# Patient Record
Sex: Female | Born: 1990 | Race: White | Hispanic: No | Marital: Married | Smoking: Never smoker
Health system: Southern US, Community
[De-identification: ages and names within clinical notes are randomized; demographics above are authoritative.]

## PROBLEM LIST (undated history)

## (undated) DIAGNOSIS — O24419 Gestational diabetes mellitus in pregnancy, unspecified control: Secondary | ICD-10-CM

## (undated) HISTORY — DX: Gestational diabetes mellitus in pregnancy, unspecified control: O24.419

---

## 2021-07-24 DIAGNOSIS — Z3403 Encounter for supervision of normal first pregnancy, third trimester: Secondary | ICD-10-CM | POA: Insufficient documentation

## 2021-08-20 LAB — OB RESULTS CONSOLE HEPATITIS B SURFACE ANTIGEN: Hepatitis B Surface Ag: NEGATIVE

## 2021-08-20 LAB — OB RESULTS CONSOLE RUBELLA ANTIBODY, IGM: Rubella: IMMUNE

## 2021-08-20 LAB — OB RESULTS CONSOLE VARICELLA ZOSTER ANTIBODY, IGG: Varicella: IMMUNE

## 2021-09-29 NOTE — L&D Delivery Note (Addendum)
Delivery Note  30yo G1P0 at 40+1 IOL for A1gDM.  At 1:58 PM a viable and healthy female "Meredith Douglas" was delivered via Vaginal, Spontaneous (Presentation:   Occiput Anterior).  APGAR: 8, 9; weight 10 lb 0.1 oz (4540 g).   Placenta status: Extracted, Intact.  Cord: 3 vessels with the following complications: None.  Azithromycin given for manual extraction of the placenta and multiple manual extraction of clots from lower uterine segment. 0.2mg  IM methergine given for atony and bleeding at this time.  Anesthesia: Epidural Episiotomy: None Lacerations: 3rd degree Suture Repair: 2.0 vicryl Est. Blood Loss (mL):  400  Mom to postpartum.  Baby to Couplet care / Skin to Skin.  Evaluation of the perineum noted a third degree laceration, with the external anal sphincter 50% torn  The external sphincter was grasped with two long Allis clamps and brought to the midline. It was closed in an end-to-end fashion with 2-0 Vicryl in multiple interrupted stiches. It came together without excess tissues tension.  A layered closure was required for a total of 13.5cm. Figure of 8 sutures placed.   The rectovaginal septum and perineal body were brought together in running layers, adding in a crown stitch to bring the deep edges of the bulbocavernosus together in the midline.   The perineal edges were brought together in the midline in a subcuticular fashion.The last knot was buried behind the hymen.     [name] 03/15/2022, 2:42 PM

## 2021-10-20 DIAGNOSIS — O26892 Other specified pregnancy related conditions, second trimester: Secondary | ICD-10-CM | POA: Insufficient documentation

## 2021-10-20 DIAGNOSIS — Z2839 Other underimmunization status: Secondary | ICD-10-CM | POA: Insufficient documentation

## 2021-12-30 ENCOUNTER — Encounter: Payer: BC Managed Care – PPO | Attending: Obstetrics | Admitting: *Deleted

## 2021-12-30 ENCOUNTER — Encounter: Payer: Self-pay | Admitting: *Deleted

## 2021-12-30 VITALS — BP 108/68 | Ht 71.0 in | Wt 214.9 lb

## 2021-12-30 DIAGNOSIS — O24410 Gestational diabetes mellitus in pregnancy, diet controlled: Secondary | ICD-10-CM

## 2021-12-30 DIAGNOSIS — Z713 Dietary counseling and surveillance: Secondary | ICD-10-CM | POA: Insufficient documentation

## 2021-12-30 DIAGNOSIS — O24419 Gestational diabetes mellitus in pregnancy, unspecified control: Secondary | ICD-10-CM | POA: Diagnosis present

## 2021-12-30 DIAGNOSIS — Z3A00 Weeks of gestation of pregnancy not specified: Secondary | ICD-10-CM | POA: Diagnosis not present

## 2021-12-30 NOTE — Patient Instructions (Signed)
Read booklet on Gestational Diabetes ?Follow Gestational Meal Planning Guidelines ?Include 1 protein and 1 carbohydrate for snacks ?Complete a 3 Day Food Record and bring to next appointment ?Check blood sugars 4 x day - before breakfast and 2 hrs after every meal and record  ?Bring blood sugar log to all appointments ?Call MD for prescription for meter strips and lancets ?Strips   One Touch Verio  Lancets   One Touch Delica Plus ?Purchase urine ketone strips if instructed by MD and check urine ketones every am:  If + increase bedtime snack to 1 protein and 2 carbohydrate servings ?Walk 20-30 minutes at least 5 x week if permitted by MD ? ?

## 2021-12-31 NOTE — Progress Notes (Signed)
Diabetes Self-Management Education ? ?Visit Type: First/Initial ? ?Appt. Start Time: 1500 Appt. End Time: 1700 ? ?12/30/2021 ? ?Ms. Meredith Douglas, identified by name and date of birth, is a 31 y.o. female with a diagnosis of Diabetes: Gestational Diabetes.  ? ?ASSESSMENT ? ?Blood pressure 108/68, height 5\' 11"  (1.803 m), weight 214 lb 14.4 oz (97.5 kg), last menstrual period 05/15/2021, estimated date of delivery 03/14/2022 ?Body mass index is 29.97 kg/m?. ? ? Diabetes Self-Management Education - 12/30/21 1826   ? ?  ? Visit Information  ? Visit Type First/Initial   ?  ? Initial Visit  ? Diabetes Type Gestational Diabetes   ? Are you currently following a meal plan? Yes   ? What type of meal plan do you follow? "low carb and low sugar"   ? Are you taking your medications as prescribed? Yes   ? Date Diagnosed 12/24/2021   ?  ? Health Coping  ? How would you rate your overall health? Good   ?  ? Psychosocial Assessment  ? Patient Belief/Attitude about Diabetes Other (comment)   "stressed"  ? Self-care barriers None   ? Self-management support Doctor's office;Family   ? Patient Concerns Glycemic Control;Nutrition/Meal planning;Medication;Monitoring   ? Special Needs None   ? Preferred Learning Style Auditory;Visual   ? Learning Readiness Change in progress   ? How often do you need to have someone help you when you read instructions, pamphlets, or other written materials from your doctor or pharmacy? 1 - Never   ? What is the last grade level you completed in school? Undergrad   ?  ? Pre-Education Assessment  ? Patient understands the diabetes disease and treatment process. Needs Instruction   ? Patient understands incorporating nutritional management into lifestyle. Needs Instruction   ? Patient undertands incorporating physical activity into lifestyle. Needs Instruction   ? Patient understands using medications safely. Needs Instruction   ? Patient understands monitoring blood glucose, interpreting and using results  Needs Instruction   ? Patient understands prevention, detection, and treatment of acute complications. Needs Instruction   ? Patient understands prevention, detection, and treatment of chronic complications. Needs Instruction   ? Patient understands how to develop strategies to address psychosocial issues. Needs Instruction   ? Patient understands how to develop strategies to promote health/change behavior. Needs Instruction   ?  ? Complications  ? Last HgB A1C per patient/outside source 5.5 %   08/19/2021  ? How often do you check your blood sugar? 0 times/day (not testing)   Provided One Touch Verio Flex meter and instructed on use. BG upon return demonstration was 83 mg/dL at 4:45 pm - 5 1/2 hrs pp  ? Have you had a dilated eye exam in the past 12 months? No   ? Have you had a dental exam in the past 12 months? Yes   ? Are you checking your feet? Yes   ? How many days per week are you checking your feet? 7   ?  ? Dietary Intake  ? Breakfast eggs, 2 pieces of toast or fruit (banana, orange)   ? Snack (morning) protein shake, crackers, string cheese   ? Lunch salad with protein, left overs   ? Snack (afternoon) same as morning snack   ? Dinner protein, starch and green vegetable - chicken, pork, beef; potatoes, beans, corn, rice, pasta, broccoli, green beans, salads with lettuce tomato onions cucumbers olives cheese   ? Beverage(s) water, coffee with oat milk 2-3 x week   ?  ?  Exercise  ? Exercise Type Moderate (swimming / aerobic walking)   cardio and weights  ? How many days per week to you exercise? 3.5   ? How many minutes per day do you exercise? 45   ? Total minutes per week of exercise 157.5   ?  ? Patient Education  ? Previous Diabetes Education No   ? Disease state  Definition of diabetes, type 1 and 2, and the diagnosis of diabetes;Factors that contribute to the development of diabetes   ? Nutrition management  Role of diet in the treatment of diabetes and the relationship between the three main  macronutrients and blood glucose level;Food label reading, portion sizes and measuring food.;Reviewed blood glucose goals for pre and post meals and how to evaluate the patients' food intake on their blood glucose level.   ? Physical activity and exercise  Role of exercise on diabetes management, blood pressure control and cardiac health.   ? Medications Other (comment)   Limited use of oral medications during pregnancy and potential for insulin  ? Monitoring Taught/evaluated SMBG meter.;Purpose and frequency of SMBG.;Taught/discussed recording of test results and interpretation of SMBG.;Identified appropriate SMBG and/or A1C goals.;Ketone testing, when, how.   ? Chronic complications Relationship between chronic complications and blood glucose control   ? Psychosocial adjustment Role of stress on diabetes;Identified and addressed patients feelings and concerns about diabetes   ? Preconception care Pregnancy and GDM  Role of pre-pregnancy blood glucose control on the development of the fetus;Reviewed with patient blood glucose goals with pregnancy;Role of family planning for patients with diabetes   ?  ? Individualized Goals (developed by patient)  ? Reducing Risk Other (comment)   improve blood sugars, decrease medications, prevent diabetes complications  ?  ? Outcomes  ? Expected Outcomes Demonstrated interest in learning. Expect positive outcomes   ? Future DMSE 2 wks   ? ?  ?  ?Individualized Plan for Diabetes Self-Management Training:  ? ?Learning Objective:  Patient will have a greater understanding of diabetes self-management. ?Patient education plan is to attend individual and/or group sessions per assessed needs and concerns. ?  ?Plan:  ? ?Patient Instructions  ?Read booklet on Gestational Diabetes ?Follow Gestational Meal Planning Guidelines ?Include 1 protein and 1 carbohydrate for snacks ?Complete a 3 Day Food Record and bring to next appointment ?Check blood sugars 4 x day - before breakfast and 2 hrs  after every meal and record  ?Bring blood sugar log to all appointments ?Call MD for prescription for meter strips and lancets ?Strips   One Touch Verio  Lancets   One Touch Delica Plus ?Purchase urine ketone strips if instructed by MD and check urine ketones every am:  If + increase bedtime snack to 1 protein and 2 carbohydrate servings ?Walk 20-30 minutes at least 5 x week if permitted by MD ? ?Expected Outcomes:  Demonstrated interest in learning. Expect positive outcomes ? ?Education material provided:  ?Gestational Booklet ?Gestational Meal Planning Guidelines ?Simple Meal Plan ?Meter - One Touch Verio Flex ?3 Day Food Record ?Goals for a Healthy Pregnancy ?Carb-Mindful Smoothie Handout ? ?If problems or questions, patient to contact team via:   ?Johny Drilling, RN, [place], [place] [phone] ? ?Future DSME appointment: 2 wks ?January 13, 2022 with the dietitian ?

## 2022-01-07 DIAGNOSIS — O24410 Gestational diabetes mellitus in pregnancy, diet controlled: Secondary | ICD-10-CM | POA: Insufficient documentation

## 2022-01-08 ENCOUNTER — Encounter: Payer: Self-pay | Admitting: Dietician

## 2022-01-08 ENCOUNTER — Encounter: Payer: BC Managed Care – PPO | Admitting: Dietician

## 2022-01-08 VITALS — Ht 71.0 in | Wt 214.9 lb

## 2022-01-08 DIAGNOSIS — O24419 Gestational diabetes mellitus in pregnancy, unspecified control: Secondary | ICD-10-CM | POA: Diagnosis not present

## 2022-01-08 DIAGNOSIS — O24410 Gestational diabetes mellitus in pregnancy, diet controlled: Secondary | ICD-10-CM

## 2022-01-08 NOTE — Progress Notes (Signed)
Patient's BG record indicates fasting BGs ranging 80-88 + one of 7 readings at 103, after missing dinner the day before (late lunch), and post-meal BGs ranging 82-106 + 2 of 22 readings at 124, 135 after high carb meals ?Patient's food diary indicates meals and snacks at regular intervals, healthy food choices and balanced nutrition. She reports following low carb eating pattern prior to pregnancy and has increased carb intake for nutritional adequacy.   ?Provided basic balanced meal plan; patient is doing well with meal planning and does not need additional resources or instruction. ?Instructed patient on food safety, including avoidance of Listeriosis, and limiting mercury from fish. ?Discussed importance of maintaining healthy lifestyle habits to reduce risk of Type 2 DM as well as Gestational DM with any future pregnancies. ?Advised patient to use any remaining testing supplies to test some BGs after delivery, and to have BG tested ideally annually, as well as prior to attempting future pregnancies. ? ?

## 2022-01-08 NOTE — Patient Instructions (Signed)
Continue with current healthy food choices and balanced meals, great job! ?Keep up regular exercise.  ?

## 2022-01-10 ENCOUNTER — Other Ambulatory Visit: Payer: Self-pay

## 2022-01-10 ENCOUNTER — Emergency Department: Payer: BC Managed Care – PPO

## 2022-01-10 ENCOUNTER — Observation Stay
Admission: EM | Admit: 2022-01-10 | Discharge: 2022-01-11 | Disposition: A | Discharge status: Home / Self Care | Payer: BC Managed Care – PPO | Attending: Certified Nurse Midwife | Admitting: Certified Nurse Midwife

## 2022-01-10 DIAGNOSIS — R109 Unspecified abdominal pain: Secondary | ICD-10-CM | POA: Diagnosis present

## 2022-01-10 DIAGNOSIS — O24410 Gestational diabetes mellitus in pregnancy, diet controlled: Secondary | ICD-10-CM | POA: Diagnosis not present

## 2022-01-10 DIAGNOSIS — O2303 Infections of kidney in pregnancy, third trimester: Principal | ICD-10-CM | POA: Insufficient documentation

## 2022-01-10 DIAGNOSIS — Z3A31 31 weeks gestation of pregnancy: Secondary | ICD-10-CM | POA: Insufficient documentation

## 2022-01-10 DIAGNOSIS — O26893 Other specified pregnancy related conditions, third trimester: Secondary | ICD-10-CM | POA: Diagnosis present

## 2022-01-10 LAB — CBC
HCT: 39.4 % (ref 36.0–46.0)
HCT: 34.2 % — ABNORMAL LOW (ref 36.0–46.0)
Hemoglobin: 12.8 g/dL (ref 12.0–15.0)
Hemoglobin: 11.3 g/dL — ABNORMAL LOW (ref 12.0–15.0)
MCH: 29.1 pg (ref 26.0–34.0)
MCH: 29.7 pg (ref 26.0–34.0)
MCHC: 32.5 g/dL (ref 30.0–36.0)
MCHC: 33 g/dL (ref 30.0–36.0)
MCV: 89.5 fL (ref 80.0–100.0)
MCV: 89.8 fL (ref 80.0–100.0)
Platelets: 247 10*3/uL (ref 150–400)
Platelets: 214 10*3/uL (ref 150–400)
RBC: 4.4 MIL/uL (ref 3.87–5.11)
RBC: 3.81 MIL/uL — ABNORMAL LOW (ref 3.87–5.11)
RDW: 12.8 % (ref 11.5–15.5)
RDW: 12.8 % (ref 11.5–15.5)
WBC: 10.2 10*3/uL (ref 4.0–10.5)
WBC: 10.6 10*3/uL — ABNORMAL HIGH (ref 4.0–10.5)
nRBC: 0 % (ref 0.0–0.2)
nRBC: 0 % (ref 0.0–0.2)

## 2022-01-10 LAB — BASIC METABOLIC PANEL
Anion gap: 9 (ref 5–15)
BUN: 8 mg/dL (ref 6–20)
CO2: 25 mmol/L (ref 22–32)
Calcium: 9.4 mg/dL (ref 8.9–10.3)
Chloride: 103 mmol/L (ref 98–111)
Creatinine, Ser: 0.58 mg/dL (ref 0.44–1.00)
GFR, Estimated: >60 mL/min (ref >60)
Glucose, Bld: 98 mg/dL (ref 70–99)
Potassium: 3.8 mmol/L (ref 3.5–5.1)
Sodium: 137 mmol/L (ref 135–145)

## 2022-01-10 LAB — URINALYSIS, ROUTINE W REFLEX MICROSCOPIC
Bilirubin Urine: NEGATIVE
Glucose, UA: NEGATIVE mg/dL
Ketones, ur: 20 mg/dL — AB
Leukocytes,Ua: NEGATIVE
Nitrite: NEGATIVE
Protein, ur: NEGATIVE mg/dL
Specific Gravity, Urine: 1.011 (ref 1.005–1.030)
pH: 6 (ref 5.0–8.0)

## 2022-01-10 MED ORDER — SODIUM CHLORIDE 0.9 % IV SOLN
1.0000 g | Freq: Once | INTRAVENOUS | Status: AC
  Administered 2022-01-10: 1 g via INTRAVENOUS
  Filled 2022-01-10: qty 10

## 2022-01-10 MED ORDER — ACETAMINOPHEN 500 MG PO TABS
1000.0000 mg | ORAL_TABLET | Freq: Once | ORAL | Status: AC
  Administered 2022-01-10: 1000 mg via ORAL
  Filled 2022-01-10: qty 2

## 2022-01-10 MED ORDER — LACTATED RINGERS IV BOLUS
1000.0000 mL | Freq: Once | INTRAVENOUS | Status: AC
  Administered 2022-01-10: 1000 mL via INTRAVENOUS

## 2022-01-10 NOTE — ED Provider Notes (Signed)
? ?[facility] ?Provider Note ? ? ? Event Date/Time  ? First MD Initiated Contact with Patient 01/10/22 1826   ?  (approximate) ? ? ?History  ? ?Chief Complaint ?Flank Pain ? ? ?HPI ? ?Meredith Douglas is a 31 y.o. female, G1P0 at approximately 31 weeks of pregnancy with no significant past medical history, presents to the ED complaining of flank pain.  Patient reports that she has been dealing with waxing and waning pain starting in her left flank and radiating down towards the left lower quadrant of her abdomen.  Pain is described as sharp and exacerbated when she goes to take a deep breath.  It has been gradually increasing in frequency and severity over the course of the day and she reports feeling nauseous with the pain, but has not vomited.  She noticed a slight bloody tinge to her urine earlier in the day, but denies any dysuria.  She has not had any fevers and denies any vaginal bleeding or discharge.  She follows with [facility] for [facility], spoke with them over the phone with the onset of symptoms and was sent for urinalysis.  UA did not appear concerning for infection but there was blood present and so patient was sent to the ED for further evaluation for possible kidney stone.  She denies any personal or family history of kidney stones.  She has not taken anything for her symptoms prior to arrival. ?  ? ? ?Physical Exam  ? ?Triage Vital Signs: ?ED Triage Vitals  ?Enc Vitals Group  ?   BP 01/10/22 1605 (!) 143/88  ?   Pulse Rate 01/10/22 1605 (!) 107  ?   Resp 01/10/22 1605 20  ?   Temp 01/10/22 1605 98 ?F (36.7 ?C)  ?   Temp src --   ?   SpO2 01/10/22 1605 97 %  ?   Weight 01/10/22 1609 214 lb (97.1 kg)  ?   Height 01/10/22 1609 5\' 11"  (1.803 m)  ?   Head Circumference --   ?   Peak Flow --   ?   Pain Score 01/10/22 1606 8  ?   Pain Loc --   ?   Pain Edu? --   ?   Excl. in GC? --   ? ? ?Most recent vital signs: ?Vitals:  ? 01/10/22 1605 01/10/22 1837  ?BP: (!) 143/88  140/78  ?Pulse: (!) 107 100  ?Resp: 20 20  ?Temp: 98 ?F (36.7 ?C)   ?SpO2: 97% 97%  ? ? ?Constitutional: Alert and oriented. ?Eyes: Conjunctivae are normal. ?Head: Atraumatic. ?Nose: No congestion/rhinnorhea. ?Mouth/Throat: Mucous membranes are moist.  ?Cardiovascular: Normal rate, regular rhythm. Grossly normal heart sounds.  2+ radial pulses bilaterally. ?Respiratory: Normal respiratory effort.  No retractions. Lungs CTAB. ?Gastrointestinal: Gravid abdomen soft and nontender.  Left CVA tenderness to palpation noted.  No distention. ?Musculoskeletal: No lower extremity tenderness nor edema.  ?Neurologic:  Normal speech and language. No gross focal neurologic deficits are appreciated. ? ? ? ?ED Results / Procedures / Treatments  ? ?Labs ?(all labs ordered are listed, but only abnormal results are displayed) ?Labs Reviewed  ?URINALYSIS, ROUTINE W REFLEX MICROSCOPIC - Abnormal; Notable for the following components:  ?    Result Value  ? Color, Urine STRAW (*)   ? APPearance CLEAR (*)   ? Hgb urine dipstick SMALL (*)   ? Ketones, ur 20 (*)   ? Bacteria, UA MANY (*)   ? All  other components within normal limits  ?URINE CULTURE  ?CBC  ?BASIC METABOLIC PANEL  ? ? ?RADIOLOGY ?Renal ultrasound reviewed by me with no obvious hydronephrosis. ? ?PROCEDURES: ? ?Critical Care performed: No ? ?Procedures ? ? ?MEDICATIONS ORDERED IN ED: ?Medications  ?cefTRIAXone (ROCEPHIN) 1 g in sodium chloride 0.9 % 100 mL IVPB (has no administration in time range)  ?lactated ringers bolus 1,000 mL (has no administration in time range)  ?acetaminophen (TYLENOL) tablet 1,000 mg (1,000 mg Oral Given by Other 01/10/22 1850)  ? ? ? ?IMPRESSION / MDM / ASSESSMENT AND PLAN / ED COURSE  ?I reviewed the triage vital signs and the nursing notes. ?             ?               ? ?31 y.o. female, G1P0 at approximately 31 weeks of pregnancy, presents to the ED complaining of gradually worsening left flank pain radiating towards the left lower quadrant of her  abdomen starting earlier today. ? ?Differential diagnosis includes, but is not limited to, pyelonephritis, kidney stone, cystitis, labor pains. ? ?Patient nontoxic-appearing and in no acute distress, vital signs are unremarkable.  She has a benign abdominal exam but does have some CVA tenderness on the left.  Initial UA obtained as an outpatient appears equivocal and at least partly contaminated, we will recheck UA here in the ED.  Presence of blood could be attributable to infection versus kidney stone and we will further assess with ultrasound for hydronephrosis that would suggest presence of kidney stone.  Labs are reassuring with CBC showing no anemia or leukocytosis, BMP without electrolyte abnormality or AKI.  Patient offered pain medication and prefers to try Tylenol at first for her symptoms. ? ?UA obtained here in the ED is more concerning for infection with no RBCs but 11-20 WBCs and many bacteria.  We will send for culture and treat with dose of Rocephin.  Renal ultrasound shows no frank hydronephrosis concerning for kidney stone.  Case discussed with Dr. Dalbert Garnet of OB/GYN, who will arrange for patient to be evaluated by one of the midwives.  Anticipate admission for observation given concern for pyelonephritis in the setting of pregnancy. ? ?  ? ? ?FINAL CLINICAL IMPRESSION(S) / ED DIAGNOSES  ? ?Final diagnoses:  ?Pyelonephritis affecting pregnancy in third trimester  ? ? ? ?Rx / DC Orders  ? ?ED Discharge Orders   ? ? None  ? ?  ? ? ? ?Note:  This document was prepared using Dragon voice recognition software and may include unintentional dictation errors. ?  ?Chesley Noon, MD ?01/10/22 2016 ? ?

## 2022-01-10 NOTE — H&P (Signed)
OB History & Physical  ? ?History of Present Illness:  ?Chief Complaint:  ? ?HPI:  ?Meredith Douglas is a 31 y.o. G1P0 female at [redacted]w[redacted]d dated by 6 week u/s.  She presents to L&D after being evaluated in the ED for left flank pain for overnight observation. She is suspected bladder infections vs pyelo.   ? ?She reports:  ?-active fetal movement ?-no leakage of fluid ?-no vaginal bleeding ?-no contractions ? ?Pregnancy Issues: ?1. GDM (Uncomplicated pregnancy, and good glucose control by diet) ?2. RH Negative ?3. Rubella NON Immune ? ? ?Maternal Medical History:  ? ?Past Medical History:  ?Diagnosis Date  ? Gestational diabetes   ? ? ?History reviewed. No pertinent surgical history. ? ?Not on File ? ?Prior to Admission medications   ?Medication Sig Start Date End Date Taking? Authorizing Provider  ?Prenatal Multivit-Min-Fe-FA (PRENATAL, W/IRON & FA,) 27-0.8 MG TABS Take 1 tablet by mouth daily.   Yes [provider]  ? ? ? ?Prenatal care site: [facility]  ? ?Social History: She  reports that she has never smoked. She has never used smokeless tobacco. She reports that she does not drink alcohol. ? ?Family History: family history is not on file.  ? ?Review of Systems: A full review of systems was performed and negative except as noted in the HPI.   ? ?Physical Exam:  ?Vital Signs: BP 125/76   Pulse 93   Temp 98 ?F (36.7 ?C)   Resp 20   Ht 5\' 11"  (1.803 m)   Wt 97.1 kg   LMP 05/15/2021 (Approximate)   SpO2 97%   BMI 29.85 kg/m?  ? ?General:   alert and cooperative  ?Skin:  normal  ?Neurologic:    Alert & oriented x 3  ?Lungs:    Nl effort  ?Heart:   regular rate and rhythm  ?Abdomen:  soft, non-tender; bowel sounds normal; no masses,  no organomegaly  ?Extremities: : non-tender, symmetric, no edema bilaterally.     ? ? ?Results for orders placed or performed during the hospital encounter of 01/10/22 (from the past 24 hour(s))  ?CBC     Status: None  ? Collection Time: 01/10/22  4:09 PM  ?Result  Value Ref Range  ? WBC 10.2 4.0 - 10.5 K/uL  ? RBC 4.40 3.87 - 5.11 MIL/uL  ? Hemoglobin 12.8 12.0 - 15.0 g/dL  ? HCT 39.4 36.0 - 46.0 %  ? MCV 89.5 80.0 - 100.0 fL  ? MCH 29.1 26.0 - 34.0 pg  ? MCHC 32.5 30.0 - 36.0 g/dL  ? RDW 12.8 11.5 - 15.5 %  ? Platelets 247 150 - 400 K/uL  ? nRBC 0.0 0.0 - 0.2 %  ?Basic metabolic panel     Status: None  ? Collection Time: 01/10/22  4:09 PM  ?Result Value Ref Range  ? Sodium 137 135 - 145 mmol/L  ? Potassium 3.8 3.5 - 5.1 mmol/L  ? Chloride 103 98 - 111 mmol/L  ? CO2 25 22 - 32 mmol/L  ? Glucose, Bld 98 70 - 99 mg/dL  ? BUN 8 6 - 20 mg/dL  ? Creatinine, Ser 0.58 0.44 - 1.00 mg/dL  ? Calcium 9.4 8.9 - 10.3 mg/dL  ? GFR, Estimated >60 >60 mL/min  ? Anion gap 9 5 - 15  ?Urinalysis, Routine w reflex microscopic Urine, Clean Catch     Status: Abnormal  ? Collection Time: 01/10/22  7:11 PM  ?Result Value Ref Range  ? Color, Urine STRAW (  A) YELLOW  ? APPearance CLEAR (A) CLEAR  ? Specific Gravity, Urine 1.011 1.005 - 1.030  ? pH 6.0 5.0 - 8.0  ? Glucose, UA NEGATIVE NEGATIVE mg/dL  ? Hgb urine dipstick SMALL (A) NEGATIVE  ? Bilirubin Urine NEGATIVE NEGATIVE  ? Ketones, ur 20 (A) NEGATIVE mg/dL  ? Protein, ur NEGATIVE NEGATIVE mg/dL  ? Nitrite NEGATIVE NEGATIVE  ? Leukocytes,Ua NEGATIVE NEGATIVE  ? RBC / HPF 0-5 0 - 5 RBC/hpf  ? WBC, UA 11-20 0 - 5 WBC/hpf  ? Bacteria, UA MANY (A) NONE SEEN  ? Squamous Epithelial / LPF 0-5 0 - 5  ? Mucus PRESENT   ? ? ?Pertinent Results:  ? ?FHT: FHR: 135 bpm, variability: moderate,  accelerations:  Present,  decelerations:  Absent ?Category/reactivity:  Category I ?TOCO: none ?SVE: deferred ?  ? ? ?US RENAL ? ?Result Date: 01/10/2022 ?CLINICAL DATA:  Left flank pain, [redacted] weeks pregnant EXAM: RENAL / URINARY TRACT ULTRASOUND COMPLETE COMPARISON:  None. FINDINGS: Right Kidney: Renal measurements: 12.6 x 5.1 x 5.7 cm = volume: 192.4 mL. Echogenicity within normal limits. No mass or hydronephrosis visualized. Left Kidney: Renal measurements: 12.7 x 5.8 x  5.1 cm = volume: 196.3 mL. Echogenicity within normal limits. Mild fullness of the renal pelvis without hydronephrosis or renal mass. Bladder: Appears normal for degree of bladder distention. Other: None. IMPRESSION: 1. Mild fullness of the left renal pelvis without frank hydronephrosis. This may be physiologic dilatation given gravid state. 2. Otherwise unremarkable exam. Electronically Signed   By: Sharlet Salina M.D.   On: 01/10/2022 19:53   ? ? ?Assessment:  ?Meredith Douglas is a 31 y.o. G1P0 female at [redacted]w[redacted]d with left flank pain.  ? ?Plan:  ?1. Admit to [facility] for obs over night ? ?2. Fetal Well being  ?- Fetal Tracing: Cat I ?- Toco quiet ? ?3. C/w Dr. Dalbert Garnet ?- CBC in am ?- Carb mod diet ?- IVF ?- monitor vitals ? ? ? ?Haroldine Laws, CNM ?01/10/2022 10:15 PM ? ?  ?

## 2022-01-10 NOTE — ED Triage Notes (Signed)
Pt to ED for left flank pain that started this am. Reports went to [facility] and was told UA had blood in it and sent to ER ?Denies N.v.  ?Denies dysuria ? ?Pt is [redacted] weeks pregnant.  ? ?Contacted L&D, states wants pt evaluated in ED first.  ?

## 2022-01-10 NOTE — ED Notes (Signed)
Pt given warm blanket. Visitor remains at bedside.  ?

## 2022-01-10 NOTE — ED Notes (Signed)
See triage note  presents with left side pain which started this am  pain became progressively worse thur out the day  pt is [redacted] weeks pregnant   ?

## 2022-01-10 NOTE — ED Notes (Signed)
Will started LR once rocephin finished since incompatible.  ?

## 2022-01-10 NOTE — ED Notes (Signed)
See triage note. Pt denies nausea. Continues to have mid L sided/flank pain at times; pain is intermittent; denies pain currently; states pain increases with movement such as standing; denies changes with urination aside from noting urine was an "orange-like color" earlier today.  ?

## 2022-01-10 NOTE — OB Triage Note (Signed)
Pt arrives from ED with c/o flank pain which she was seen for in ED. Pt states pain at this 1/10 and feels much better. Pt is in NAD. ?

## 2022-01-11 ENCOUNTER — Other Ambulatory Visit: Payer: Self-pay

## 2022-01-11 LAB — CBC WITH DIFFERENTIAL/PLATELET
Abs Immature Granulocytes: 0.05 10*3/uL (ref 0.00–0.07)
Basophils Absolute: 0 10*3/uL (ref 0.0–0.1)
Basophils Relative: 0 %
Eosinophils Absolute: 0.1 10*3/uL (ref 0.0–0.5)
Eosinophils Relative: 1 %
HCT: 33 % — ABNORMAL LOW (ref 36.0–46.0)
Hemoglobin: 10.8 g/dL — ABNORMAL LOW (ref 12.0–15.0)
Immature Granulocytes: 1 %
Lymphocytes Relative: 21 %
Lymphs Abs: 1.7 10*3/uL (ref 0.7–4.0)
MCH: 29.3 pg (ref 26.0–34.0)
MCHC: 32.7 g/dL (ref 30.0–36.0)
MCV: 89.4 fL (ref 80.0–100.0)
Monocytes Absolute: 0.8 10*3/uL (ref 0.1–1.0)
Monocytes Relative: 10 %
Neutro Abs: 5.4 10*3/uL (ref 1.7–7.7)
Neutrophils Relative %: 67 %
Platelets: 224 10*3/uL (ref 150–400)
RBC: 3.69 MIL/uL — ABNORMAL LOW (ref 3.87–5.11)
RDW: 13.1 % (ref 11.5–15.5)
WBC: 8 10*3/uL (ref 4.0–10.5)
nRBC: 0 % (ref 0.0–0.2)

## 2022-01-11 LAB — GLUCOSE, CAPILLARY
Glucose-Capillary: 122 mg/dL — ABNORMAL HIGH (ref 70–99)
Glucose-Capillary: 101 mg/dL — ABNORMAL HIGH (ref 70–99)

## 2022-01-11 MED ORDER — CEPHALEXIN 250 MG/5ML PO SUSR
500.0000 mg | Freq: Four times a day (QID) | ORAL | 0 refills | Status: AC
Start: 2022-01-11 — End: 2022-01-21

## 2022-01-11 MED ORDER — CEPHALEXIN 500 MG PO CAPS: 500.0000 mg | ORAL_CAPSULE | Freq: Four times a day (QID) | ORAL | 0 refills | Status: DC

## 2022-01-11 MED ORDER — ACETAMINOPHEN 500 MG PO TABS
1000.0000 mg | ORAL_TABLET | Freq: Once | ORAL | Status: AC
  Administered 2022-01-11: 1000 mg via ORAL
  Filled 2022-01-11: qty 2

## 2022-01-11 NOTE — Discharge Summary (Signed)
Patient ID: ?Meredith Douglas ?MRN: [id number] ?DOB/AGE: 1991/08/31 30 y.o. ? ?Admit date: 01/10/2022 ?Discharge date: 01/11/2022 ? ?Admission Diagnoses: Pyelonephritis affecting pregnancy in third trimester [O23.03] ?Acute left flank pain [R10.9] ? ?Discharge Diagnoses: same ? ?Prenatal Care Site: [facility] ? ?Prenatal Procedures: none ? ?Significant Diagnostic Studies:  ?Results for orders placed or performed during the hospital encounter of 01/10/22 (from the past 168 hour(s))  ?CBC  ? Collection Time: 01/10/22  4:09 PM  ?Result Value Ref Range  ? WBC 10.2 4.0 - 10.5 K/uL  ? RBC 4.40 3.87 - 5.11 MIL/uL  ? Hemoglobin 12.8 12.0 - 15.0 g/dL  ? HCT 39.4 36.0 - 46.0 %  ? MCV 89.5 80.0 - 100.0 fL  ? MCH 29.1 26.0 - 34.0 pg  ? MCHC 32.5 30.0 - 36.0 g/dL  ? RDW 12.8 11.5 - 15.5 %  ? Platelets 247 150 - 400 K/uL  ? nRBC 0.0 0.0 - 0.2 %  ?Basic metabolic panel  ? Collection Time: 01/10/22  4:09 PM  ?Result Value Ref Range  ? Sodium 137 135 - 145 mmol/L  ? Potassium 3.8 3.5 - 5.1 mmol/L  ? Chloride 103 98 - 111 mmol/L  ? CO2 25 22 - 32 mmol/L  ? Glucose, Bld 98 70 - 99 mg/dL  ? BUN 8 6 - 20 mg/dL  ? Creatinine, Ser 0.58 0.44 - 1.00 mg/dL  ? Calcium 9.4 8.9 - 10.3 mg/dL  ? GFR, Estimated >60 >60 mL/min  ? Anion gap 9 5 - 15  ?Urinalysis, Routine w reflex microscopic Urine, Clean Catch  ? Collection Time: 01/10/22  7:11 PM  ?Result Value Ref Range  ? Color, Urine STRAW (A) YELLOW  ? APPearance CLEAR (A) CLEAR  ? Specific Gravity, Urine 1.011 1.005 - 1.030  ? pH 6.0 5.0 - 8.0  ? Glucose, UA NEGATIVE NEGATIVE mg/dL  ? Hgb urine dipstick SMALL (A) NEGATIVE  ? Bilirubin Urine NEGATIVE NEGATIVE  ? Ketones, ur 20 (A) NEGATIVE mg/dL  ? Protein, ur NEGATIVE NEGATIVE mg/dL  ? Nitrite NEGATIVE NEGATIVE  ? Leukocytes,Ua NEGATIVE NEGATIVE  ? RBC / HPF 0-5 0 - 5 RBC/hpf  ? WBC, UA 11-20 0 - 5 WBC/hpf  ? Bacteria, UA MANY (A) NONE SEEN  ? Squamous Epithelial / LPF 0-5 0 - 5  ? Mucus PRESENT   ?CBC  ? Collection Time: 01/10/22 10:35  PM  ?Result Value Ref Range  ? WBC 10.6 (H) 4.0 - 10.5 K/uL  ? RBC 3.81 (L) 3.87 - 5.11 MIL/uL  ? Hemoglobin 11.3 (L) 12.0 - 15.0 g/dL  ? HCT 34.2 (L) 36.0 - 46.0 %  ? MCV 89.8 80.0 - 100.0 fL  ? MCH 29.7 26.0 - 34.0 pg  ? MCHC 33.0 30.0 - 36.0 g/dL  ? RDW 12.8 11.5 - 15.5 %  ? Platelets 214 150 - 400 K/uL  ? nRBC 0.0 0.0 - 0.2 %  ?Glucose, capillary  ? Collection Time: 01/11/22  1:29 AM  ?Result Value Ref Range  ? Glucose-Capillary 122 (H) 70 - 99 mg/dL  ?Glucose, capillary  ? Collection Time: 01/11/22  7:31 AM  ?Result Value Ref Range  ? Glucose-Capillary 101 (H) 70 - 99 mg/dL  ?CBC with Differential/Platelet  ? Collection Time: 01/11/22  8:12 AM  ?Result Value Ref Range  ? WBC 8.0 4.0 - 10.5 K/uL  ? RBC 3.69 (L) 3.87 - 5.11 MIL/uL  ? Hemoglobin 10.8 (L) 12.0 - 15.0 g/dL  ? HCT 33.0 (L) 36.0 -  46.0 %  ? MCV 89.4 80.0 - 100.0 fL  ? MCH 29.3 26.0 - 34.0 pg  ? MCHC 32.7 30.0 - 36.0 g/dL  ? RDW 13.1 11.5 - 15.5 %  ? Platelets 224 150 - 400 K/uL  ? nRBC 0.0 0.0 - 0.2 %  ? Neutrophils Relative % 67 %  ? Neutro Abs 5.4 1.7 - 7.7 K/uL  ? Lymphocytes Relative 21 %  ? Lymphs Abs 1.7 0.7 - 4.0 K/uL  ? Monocytes Relative 10 %  ? Monocytes Absolute 0.8 0.1 - 1.0 K/uL  ? Eosinophils Relative 1 %  ? Eosinophils Absolute 0.1 0.0 - 0.5 K/uL  ? Basophils Relative 0 %  ? Basophils Absolute 0.0 0.0 - 0.1 K/uL  ? Immature Granulocytes 1 %  ? Abs Immature Granulocytes 0.05 0.00 - 0.07 K/uL  ? ? ?Treatments: IV hydration and antibiotics: rocephin ? ?Hospital Course:  ?This is a 31 y.o. G1P0 with IUP at [redacted]w[redacted]d admitted for left flank pain.  She was given IV rocephin and pain was controlled with one dose of acetaminophen.  Repeat CBC was negative for acute infection and renal US was reassuring.  [place] remained afebrile during her observation.  She was deemed stable for discharge to home on oral antibiotics with outpatient follow up.  ? ?Discharge Physical Exam:  ?BP 113/65 (BP Location: Left Arm)   Pulse 91   Temp 98.7 ?F (37.1 ?C)  (Oral)   Resp 17   Ht 5\' 11"  (1.803 m)   Wt 97.1 kg   LMP 05/15/2021 (Approximate)   SpO2 98%   BMI 29.85 kg/m?  ? ?General: NAD ?CV: RRR ?Pulm: CTABL, nl effort ?ABD: s/nd/nt, gravid, no CVA tenderness ?DVT Evaluation: LE non-ttp, no evidence of DVT on exam. ?SVE: deferred  ? ? ?Discharge Condition: Stable ? ?Disposition: Discharge disposition: 01-Home or Self Care ? ? ? ? ? ?Allergies as of 01/11/2022   ?Not on File ?  ? ?  ?Medication List  ?  ? ?TAKE these medications   ? ?cephALEXin 250 MG/5ML suspension ?Commonly known as: KEFLEX ?Take 10 mLs (500 mg total) by mouth 4 (four) times daily for 10 days. ?  ?Prenatal (w/Iron & FA) 27-0.8 MG Tabs ?Take 1 tablet by mouth daily. ?  ? ?  ? ? Follow-up Information   ? ? [facility]. Schedule an appointment as soon as possible for a visit in 1 week(s).   ?Why: prenatal visit ?Contact information: ?[address] ?[place] ?[phone] ? ?  ?  ? ?  ?  ? ?  ? ? ?Signed: ? ?[phone] ?01/11/2022 12:24 PM ?----- ?01/13/2022, CNM ?Certified Nurse Midwife ?[facility] ?[facility] ? ? ?

## 2022-01-11 NOTE — Progress Notes (Signed)
Pt educated on discharge instructions, medications, and follow up appointments. Patient verbalized understanding. Escorted out by staff. ?

## 2022-01-12 LAB — URINE CULTURE: Culture: <10000 — AB

## 2022-01-13 ENCOUNTER — Ambulatory Visit: Payer: BC Managed Care – PPO | Admitting: Dietician

## 2022-02-18 LAB — OB RESULTS CONSOLE GBS: GBS: NEGATIVE

## 2022-02-18 LAB — OB RESULTS CONSOLE GC/CHLAMYDIA
Chlamydia: NEGATIVE
Neisseria Gonorrhea: NEGATIVE

## 2022-02-18 LAB — OB RESULTS CONSOLE HIV ANTIBODY (ROUTINE TESTING): HIV: NONREACTIVE

## 2022-02-18 LAB — OB RESULTS CONSOLE RPR: RPR: NONREACTIVE

## 2022-03-04 ENCOUNTER — Other Ambulatory Visit: Payer: Self-pay | Admitting: Certified Nurse Midwife

## 2022-03-04 DIAGNOSIS — Z3490 Encounter for supervision of normal pregnancy, unspecified, unspecified trimester: Secondary | ICD-10-CM

## 2022-03-04 NOTE — Progress Notes (Signed)
G1P0 at [redacted]w[redacted]d, LMP of 05/15/21, inconsistent  with early [place] at 105w5d.  Scheduled for induction of labor for GDM on 03/14/22 @ 0001.   Prenatal provider: [facility] Pregnancy complicated by: A1 GDM RH negative Rubella non immune  Prenatal Labs: Blood type/Rh A neg  Antibody screen neg  Rubella Non-Immune  Varicella Immune  RPR NR  HBsAg Neg  Hep C NR  HIV NR  GC neg  Chlamydia neg  Genetic screening cfDNA declined  1 hour GTT 212  3 hour GTT N/a  GBS neg   Tdap: declined Flu: declined Contraception: LAM Feeding preference: Breast  ____ Chari Manning, CNM Certified Nurse Midwife [facility]

## 2022-03-14 ENCOUNTER — Other Ambulatory Visit: Payer: Self-pay

## 2022-03-14 ENCOUNTER — Encounter: Payer: Self-pay | Admitting: Obstetrics and Gynecology

## 2022-03-14 ENCOUNTER — Inpatient Hospital Stay: Payer: BC Managed Care – PPO | Admitting: Anesthesiology

## 2022-03-14 ENCOUNTER — Inpatient Hospital Stay
Admission: EM | Admit: 2022-03-14 | Discharge: 2022-03-17 | DRG: 768 | Disposition: A | Discharge status: Home / Self Care | Payer: BC Managed Care – PPO | Attending: Obstetrics | Admitting: Obstetrics

## 2022-03-14 DIAGNOSIS — D62 Acute posthemorrhagic anemia: Secondary | ICD-10-CM | POA: Diagnosis not present

## 2022-03-14 DIAGNOSIS — O24420 Gestational diabetes mellitus in childbirth, diet controlled: Secondary | ICD-10-CM | POA: Diagnosis present

## 2022-03-14 DIAGNOSIS — Z6791 Unspecified blood type, Rh negative: Secondary | ICD-10-CM | POA: Diagnosis not present

## 2022-03-14 DIAGNOSIS — O9081 Anemia of the puerperium: Secondary | ICD-10-CM | POA: Diagnosis not present

## 2022-03-14 DIAGNOSIS — Z3490 Encounter for supervision of normal pregnancy, unspecified, unspecified trimester: Principal | ICD-10-CM

## 2022-03-14 DIAGNOSIS — Z3A40 40 weeks gestation of pregnancy: Secondary | ICD-10-CM

## 2022-03-14 DIAGNOSIS — O26893 Other specified pregnancy related conditions, third trimester: Secondary | ICD-10-CM | POA: Diagnosis present

## 2022-03-14 LAB — CBC
HCT: 35.3 % — ABNORMAL LOW (ref 36.0–46.0)
Hemoglobin: 11.2 g/dL — ABNORMAL LOW (ref 12.0–15.0)
MCH: 27 pg (ref 26.0–34.0)
MCHC: 31.7 g/dL (ref 30.0–36.0)
MCV: 85.1 fL (ref 80.0–100.0)
Platelets: 242 10*3/uL (ref 150–400)
RBC: 4.15 MIL/uL (ref 3.87–5.11)
RDW: 13.2 % (ref 11.5–15.5)
WBC: 9.3 10*3/uL (ref 4.0–10.5)
nRBC: 0 % (ref 0.0–0.2)

## 2022-03-14 LAB — GLUCOSE, CAPILLARY
Glucose-Capillary: 91 mg/dL (ref 70–99)
Glucose-Capillary: 104 mg/dL — ABNORMAL HIGH (ref 70–99)
Glucose-Capillary: 71 mg/dL (ref 70–99)
Glucose-Capillary: 121 mg/dL — ABNORMAL HIGH (ref 70–99)

## 2022-03-14 LAB — GLUCOSE, RANDOM: Glucose, Bld: 88 mg/dL (ref 70–99)

## 2022-03-14 LAB — RPR: RPR Ser Ql: NONREACTIVE

## 2022-03-14 MED ORDER — LIDOCAINE-EPINEPHRINE (PF) 1.5 %-1:200000 IJ SOLN
INTRAMUSCULAR | Status: DC | PRN
  Administered 2022-03-14: 3 mL via EPIDURAL

## 2022-03-14 MED ORDER — DIPHENHYDRAMINE HCL 50 MG/ML IJ SOLN: 12.5000 mg | INTRAMUSCULAR | Status: DC | PRN

## 2022-03-14 MED ORDER — OXYTOCIN-SODIUM CHLORIDE 30-0.9 UT/500ML-% IV SOLN
1.0000 m[IU]/min | INTRAVENOUS | Status: DC
  Administered 2022-03-14: 2 m[IU]/min via INTRAVENOUS

## 2022-03-14 MED ORDER — FENTANYL-BUPIVACAINE-NACL 0.5-0.125-0.9 MG/250ML-% EP SOLN
EPIDURAL | Status: AC
  Filled 2022-03-14: qty 250

## 2022-03-14 MED ORDER — PHENYLEPHRINE 80 MCG/ML (10ML) SYRINGE FOR IV PUSH (FOR BLOOD PRESSURE SUPPORT): 80.0000 ug | PREFILLED_SYRINGE | INTRAVENOUS | Status: DC | PRN

## 2022-03-14 MED ORDER — LIDOCAINE HCL (PF) 1 % IJ SOLN
INTRAMUSCULAR | Status: AC
  Filled 2022-03-14: qty 30

## 2022-03-14 MED ORDER — LIDOCAINE HCL (PF) 1 % IJ SOLN: 30.0000 mL | INTRAMUSCULAR | Status: DC | PRN

## 2022-03-14 MED ORDER — FENTANYL CITRATE (PF) 100 MCG/2ML IJ SOLN
50.0000 ug | INTRAMUSCULAR | Status: DC | PRN
  Administered 2022-03-14 (×2): 50 ug via INTRAVENOUS
  Filled 2022-03-14 (×2): qty 2

## 2022-03-14 MED ORDER — LIDOCAINE HCL (PF) 1 % IJ SOLN
INTRAMUSCULAR | Status: DC | PRN
  Administered 2022-03-14: 3 mL via SUBCUTANEOUS

## 2022-03-14 MED ORDER — ONDANSETRON HCL 4 MG/2ML IJ SOLN
4.0000 mg | Freq: Four times a day (QID) | INTRAMUSCULAR | Status: DC | PRN
  Administered 2022-03-15 (×2): 4 mg via INTRAVENOUS
  Filled 2022-03-14 (×2): qty 2

## 2022-03-14 MED ORDER — FENTANYL-BUPIVACAINE-NACL 0.5-0.125-0.9 MG/250ML-% EP SOLN
EPIDURAL | Status: DC | PRN
  Administered 2022-03-14: 12 mL/h via EPIDURAL

## 2022-03-14 MED ORDER — SODIUM CHLORIDE 0.9 % IV SOLN
INTRAVENOUS | Status: DC | PRN
  Administered 2022-03-14 (×2): 5 mL via EPIDURAL

## 2022-03-14 MED ORDER — ACETAMINOPHEN 325 MG PO TABS
650.0000 mg | ORAL_TABLET | ORAL | Status: DC | PRN
  Filled 2022-03-14: qty 2

## 2022-03-14 MED ORDER — MISOPROSTOL 25 MCG QUARTER TABLET
25.0000 ug | ORAL_TABLET | ORAL | Status: DC | PRN
  Administered 2022-03-14 (×3): 25 ug via BUCCAL
  Filled 2022-03-14 (×3): qty 1

## 2022-03-14 MED ORDER — OXYTOCIN-SODIUM CHLORIDE 30-0.9 UT/500ML-% IV SOLN
2.5000 [IU]/h | INTRAVENOUS | Status: DC
  Administered 2022-03-15: 2.5 [IU]/h via INTRAVENOUS
  Filled 2022-03-14: qty 500

## 2022-03-14 MED ORDER — OXYTOCIN 10 UNIT/ML IJ SOLN
INTRAMUSCULAR | Status: AC
  Filled 2022-03-14: qty 2

## 2022-03-14 MED ORDER — AMMONIA AROMATIC IN INHA
RESPIRATORY_TRACT | Status: AC
  Filled 2022-03-14: qty 10

## 2022-03-14 MED ORDER — OXYTOCIN BOLUS FROM INFUSION
333.0000 mL | Freq: Once | INTRAVENOUS | Status: AC
  Administered 2022-03-15: 333 mL via INTRAVENOUS

## 2022-03-14 MED ORDER — LACTATED RINGERS IV SOLN
500.0000 mL | Freq: Once | INTRAVENOUS | Status: AC
  Administered 2022-03-14: 500 mL via INTRAVENOUS

## 2022-03-14 MED ORDER — FENTANYL-BUPIVACAINE-NACL 0.5-0.125-0.9 MG/250ML-% EP SOLN
12.0000 mL/h | EPIDURAL | Status: DC | PRN
  Filled 2022-03-14: qty 250

## 2022-03-14 MED ORDER — TERBUTALINE SULFATE 1 MG/ML IJ SOLN: 0.2500 mg | Freq: Once | INTRAMUSCULAR | Status: DC | PRN

## 2022-03-14 MED ORDER — FENTANYL CITRATE (PF) 100 MCG/2ML IJ SOLN
100.0000 ug | Freq: Once | INTRAMUSCULAR | Status: AC
  Administered 2022-03-14: 100 ug via INTRAVENOUS
  Filled 2022-03-14: qty 2

## 2022-03-14 MED ORDER — MISOPROSTOL 200 MCG PO TABS
ORAL_TABLET | ORAL | Status: AC
  Filled 2022-03-14: qty 4

## 2022-03-14 MED ORDER — EPHEDRINE 5 MG/ML INJ: 10.0000 mg | INTRAVENOUS | Status: DC | PRN

## 2022-03-14 MED ORDER — SOD CITRATE-CITRIC ACID 500-334 MG/5ML PO SOLN: 30.0000 mL | ORAL | Status: DC | PRN

## 2022-03-14 MED ORDER — LACTATED RINGERS IV SOLN: INTRAVENOUS | Status: DC

## 2022-03-14 MED ORDER — LACTATED RINGERS IV SOLN
500.0000 mL | INTRAVENOUS | Status: DC | PRN
  Administered 2022-03-15: 500 mL via INTRAVENOUS

## 2022-03-14 MED ORDER — MISOPROSTOL 25 MCG QUARTER TABLET
25.0000 ug | ORAL_TABLET | ORAL | Status: DC | PRN
  Administered 2022-03-14 (×3): 25 ug via VAGINAL
  Filled 2022-03-14 (×3): qty 1

## 2022-03-14 NOTE — Anesthesia Procedure Notes (Signed)
Epidural Patient location during procedure: OB Start time: 03/14/2022 10:19 PM End time: 03/14/2022 10:23 PM  Staffing Anesthesiologist: Lenard Simmer, MD Performed: anesthesiologist   Preanesthetic Checklist Completed: patient identified, IV checked, site marked, risks and benefits discussed, surgical consent, monitors and equipment checked, pre-op evaluation and timeout performed  Epidural Patient position: sitting Prep: ChloraPrep Patient monitoring: heart rate, continuous pulse ox and blood pressure Approach: midline Location: L3-L4 Injection technique: LOR saline  Needle:  Needle type: Tuohy  Needle gauge: 17 G Needle length: 9 cm and 9 Needle insertion depth: 5 cm Catheter type: closed end flexible Catheter size: 19 Gauge Catheter at skin depth: 10 cm Test dose: negative and 1.5% lidocaine with Epi 1:200 K  Assessment Sensory level: T10 Events: blood not aspirated, injection not painful, no injection resistance, no paresthesia and negative IV test  Additional Notes 1st attempt Pt. Evaluated and documentation done after procedure finished. Patient identified. Risks/Benefits/Options discussed with patient including but not limited to bleeding, infection, nerve damage, paralysis, failed block, incomplete pain control, headache, blood pressure changes, nausea, vomiting, reactions to medication both or allergic, itching and postpartum back pain. Confirmed with bedside nurse the patient's most recent platelet count. Confirmed with patient that they are not currently taking any anticoagulation, have any bleeding history or any family history of bleeding disorders. Patient expressed understanding and wished to proceed. All questions were answered. Sterile technique was used throughout the entire procedure. Please see nursing notes for vital signs. Test dose was given through epidural catheter and negative prior to continuing to dose epidural or start infusion. Warning signs of high  block given to the patient including shortness of breath, tingling/numbness in hands, complete motor block, or any concerning symptoms with instructions to call for help. Patient was given instructions on fall risk and not to get out of bed. All questions and concerns addressed with instructions to call with any issues or inadequate analgesia.    Patient tolerated the insertion well without immediate complications.Reason for block:procedure for pain

## 2022-03-14 NOTE — Progress Notes (Addendum)
Labor Progress Note  Meredith Douglas is a 31 y.o. G1P0 at [redacted]w[redacted]d by ultrasound admitted for induction of labor due to Gestational diabetes.  Subjective: Pt reports feeling UCs but she is coping well.  Objective: BP 130/80 (BP Location: Left Arm)   Pulse (!) 103   Temp 98.3 F (36.8 C) (Oral)   Resp 18   Ht 5\' 11"  (1.803 m)   Wt 102.1 kg   LMP 05/15/2021 (Approximate)   BMI 31.38 kg/m    Fetal Assessment: FHT:  FHR: 150 bpm, variability: moderate,  accelerations:  Present,  decelerations:  Present Late  - resolved with position change by RN Category/reactivity:  Category I UC:   regular, every 1-4 minutes SVE:    Dilation: 1cm  Effacement: 80%  Station:  -3  Consistency: soft  Position: posterior  Membrane status: Intact Amniotic color: n/a  Labs: Lab Results  Component Value Date   WBC 9.3 03/14/2022   HGB 11.2 (L) 03/14/2022   HCT 35.3 (L) 03/14/2022   MCV 85.1 03/14/2022   PLT 242 03/14/2022    Assessment / Plan: Induction of labor due to gestational diabetes, progressing on pitocin  03/16/2022 vaginal and buccal at 0104, 0543, 0957 Pitocin at 24mU  Labor: Progressing normally Preeclampsia:   130/80 Fetal Wellbeing:  Category I Cat II with resolution after position change Pain Control:  Labor support without medications I/D:   Afebrile, GBS neg, Intact  Anticipated MOD:  NSVD  11m, CNM 03/14/2022, 8:09 PM

## 2022-03-14 NOTE — Progress Notes (Signed)
Labor Progress Note  Meredith Douglas is a 31 y.o. G1P0 at [redacted]w[redacted]d by ultrasound admitted for induction of labor due to Gestational diabetes.  Subjective: Pt reports feeling UCs but she is coping well.  Objective: BP 130/80 (BP Location: Left Arm)   Pulse (!) 103   Temp 98.3 F (36.8 C) (Oral)   Resp 18   Ht 5\' 11"  (1.803 m)   Wt 102.1 kg   LMP 05/15/2021 (Approximate)   BMI 31.38 kg/m    Fetal Assessment: FHT:  FHR: 135 bpm, variability: moderate,  accelerations:  Present,  decelerations:  Absent  Category/reactivity:  Category I UC:   not tracing well SVE:    Dilation: 2cm  Effacement: 80%  Station:  -3  Consistency: soft  Position: middle  Membrane status: Intact Amniotic color: n/a  Labs: Lab Results  Component Value Date   WBC 9.3 03/14/2022   HGB 11.2 (L) 03/14/2022   HCT 35.3 (L) 03/14/2022   MCV 85.1 03/14/2022   PLT 242 03/14/2022    Assessment / Plan: Induction of labor due to gestational diabetes, progressing on pitocin  03/16/2022 vaginal and buccal at 0104, 0543, 0957 Pitocin at 8mU  Labor: Progressing normally Preeclampsia:   130/80 Fetal Wellbeing:  Category I  Pain Control:  IV pain meds - 9m at 1815 I/D:   Afebrile, GBS neg, Intact  Anticipated MOD:  NSVD  , CNM 03/14/2022, 8:13 PM

## 2022-03-14 NOTE — Progress Notes (Signed)
Labor Progress Note  Meredith Douglas is a 31 y.o. G1P0 at [redacted]w[redacted]d by ultrasound admitted for induction of labor due to Gestational diabetes.  Subjective: Pt reports feeling UCs but she is coping well.   Objective: BP 130/80 (BP Location: Left Arm)   Pulse (!) 103   Temp 98.3 F (36.8 C) (Oral)   Resp 18   Ht 5\' 11"  (1.803 m)   Wt 102.1 kg   LMP 05/15/2021 (Approximate)   BMI 31.38 kg/m    Fetal Assessment: FHT:  FHR: 130 bpm, variability: moderate,  accelerations:  Present,  decelerations:  Absent Category/reactivity:  Category I UC:   regular, every 1-4 minutes SVE:    Dilation: 1cm  Effacement: 80%  Station:  -3  Consistency: soft  Position: posterior  Membrane status: Intact Amniotic color: n/a  Labs: Lab Results  Component Value Date   WBC 9.3 03/14/2022   HGB 11.2 (L) 03/14/2022   HCT 35.3 (L) 03/14/2022   MCV 85.1 03/14/2022   PLT 242 03/14/2022    Assessment / Plan: Induction of labor due to gestational diabetes, starting Pitocin  03/16/2022 vaginal and buccal at 0104, 0543, 0957   Labor: Progressing normally Preeclampsia:   127/81 Fetal Wellbeing:  Category I Pain Control:  Labor support without medications I/D:   Afebrile, GBS neg, Intact  Anticipated MOD:  NSVD  0958, CNM 03/14/2022, 8:05 PM

## 2022-03-14 NOTE — H&P (Cosign Needed)
OB History & Physical   History of Present Illness:  Chief Complaint:   HPI:  Meredith Douglas is a 31 y.o. G1P0 female at [redacted]w[redacted]d dated by 6 week u/s.  She presents to L&D for IOL for GDM, diet controled   She reports:  -active fetal movement -no leakage of fluid -no vaginal bleeding -no contractions  Pregnancy Issues: 1. A1 GDM RH negative Rubella non immune   Maternal Medical History:   Past Medical History:  Diagnosis Date   Gestational diabetes     History reviewed. No pertinent surgical history.  Not on File  Prior to Admission medications   Medication Sig Start Date End Date Taking? Authorizing Provider  Prenatal Multivit-Min-Fe-FA (PRENATAL, W/IRON & FA,) 27-0.8 MG TABS Take 1 tablet by mouth daily.   Yes [provider]     Prenatal care site: [facility]   Social History: She  reports that she has never smoked. She has never used smokeless tobacco. She reports that she does not drink alcohol and does not use drugs.  Family History: family history is not on file.   Review of Systems: A full review of systems was performed and negative except as noted in the HPI.    Physical Exam:  Vital Signs: BP 123/80 (BP Location: Left Arm)   Pulse 93   Temp 98.1 F (36.7 C) (Oral)   Resp 18   Ht 5\' 11"  (1.803 m)   Wt 102.1 kg   LMP 05/15/2021 (Approximate)   BMI 31.38 kg/m   General:   alert and cooperative  Skin:  normal  Neurologic:    Alert & oriented x 3  Lungs:    Nl effort  Heart:   regular rate and rhythm  Abdomen:  soft, non-tender; bowel sounds normal; no masses,  no organomegaly  Extremities: : non-tender, symmetric, no edema bilaterally.      EFW: 02/18/22:  3106 G= 77 %  Results for orders placed or performed during the hospital encounter of 03/14/22 (from the past 24 hour(s))  CBC     Status: Abnormal   Collection Time: 03/14/22 12:32 AM  Result Value Ref Range   WBC 9.3 4.0 - 10.5 K/uL   RBC 4.15 3.87 - 5.11 MIL/uL    Hemoglobin 11.2 (L) 12.0 - 15.0 g/dL   HCT 03/16/22 (L) 66.0 - 63.0 %   MCV 85.1 80.0 - 100.0 fL   MCH 27.0 26.0 - 34.0 pg   MCHC 31.7 30.0 - 36.0 g/dL   RDW 16.0 10.9 - 32.3 %   Platelets 242 150 - 400 K/uL   nRBC 0.0 0.0 - 0.2 %  Type and screen     Status: None   Collection Time: 03/14/22 12:32 AM  Result Value Ref Range   ABO/RH(D) A NEG    Antibody Screen POS    Sample Expiration 03/17/2022,2359    Antibody Identification      PASSIVELY ACQUIRED ANTI-D Performed at [facility], [address] [place], [place]   RPR     Status: None   Collection Time: 03/14/22 12:32 AM  Result Value Ref Range   RPR Ser Ql NON REACTIVE NON REACTIVE  Glucose, random     Status: None   Collection Time: 03/14/22 12:32 AM  Result Value Ref Range   Glucose, Bld 88 70 - 99 mg/dL  ABO/Rh     Status: None   Collection Time: 03/14/22 12:55 AM  Result Value Ref Range   ABO/RH(D)  A NEG    Weak D      NEG Performed at [facility], [address] [place], [place] [address]   Glucose, capillary     Status: None   Collection Time: 03/14/22  5:29 AM  Result Value Ref Range   Glucose-Capillary 91 70 - 99 mg/dL  Glucose, capillary     Status: Abnormal   Collection Time: 03/14/22  9:36 AM  Result Value Ref Range   Glucose-Capillary 104 (H) 70 - 99 mg/dL    Pertinent Results:  Prenatal Labs: Blood type/Rh A neg  Antibody screen neg  Rubella Non-Immune  Varicella Immune  RPR NR  HBsAg Neg  Hep C NR  HIV NR  GC neg  Chlamydia neg  Genetic screening cfDNA declined  1 hour GTT 212  3 hour GTT N/a  GBS neg   FHT: FHR: 135 bpm, variability: moderate,  accelerations:  Present,  decelerations:  Absent Category/reactivity:  Category I TOCO: regular, every 1-3 minutes mild SVE: Dilation: 1.5 / Effacement (%): 50 / Station: -3     Assessment:  Meredith Douglas is a 31 y.o. G1P0 female at [redacted]w[redacted]d here for IOL for GDM.   Plan:  1. Admit to Labor & Delivery; consents  reviewed and obtained  2. Fetal Well being  - Fetal Tracing: Cat I - GBS neg - Presentation: vtx confirmed by sve   3. Routine OB: - Prenatal labs reviewed, as above - Rh neg - CBC & T&S on admit - Clear fluids, IVF  4. Induction of Labor -  Contractions by external toco in place -  Plan for induction with Cytotec, Pitocin, AROM -  Plan for continuous fetal monitoring  -  Maternal pain control as desired: IVPM, nitrous, regional anesthesia - Anticipate vaginal delivery  5. Post Partum Planning: - Infant feeding: Breast - Contraception: LAM - Tdap: declined - Flu: declined  Zavion Sleight, CNM 03/14/2022 10:16 AM

## 2022-03-14 NOTE — Progress Notes (Signed)
Labor Progress Note  Meredith Douglas is a 31 y.o. G1P0 at [redacted]w[redacted]d by ultrasound admitted for induction of labor due to Gestational diabetes.  Subjective: Pt reports feeling UCs but she is coping well. Discussed plan of care.  Objective: BP 123/80 (BP Location: Left Arm)   Pulse 93   Temp 98.1 F (36.7 C) (Oral)   Resp 18   Ht 5\' 11"  (1.803 m)   Wt 102.1 kg   LMP 05/15/2021 (Approximate)   BMI 31.38 kg/m    Fetal Assessment: FHT:  FHR: 135 bpm, variability: moderate,  accelerations:  Present,  decelerations:  Absent Category/reactivity:  Category I UC:   regular, every 2-3 minutes SVE:    Dilation: 1cm  Effacement: 70%  Station:  -3  Consistency: medium  Position: posterior  Membrane status: Intact Amniotic color: n/a  Labs: Lab Results  Component Value Date   WBC 9.3 03/14/2022   HGB 11.2 (L) 03/14/2022   HCT 35.3 (L) 03/14/2022   MCV 85.1 03/14/2022   PLT 242 03/14/2022    Assessment / Plan: Induction of labor due to gestational diabetes,  progressing well on cytotec  03/16/2022 vaginal and buccal at 0104, 0543, 0957   Labor: Progressing normally Preeclampsia:   123/80 Fetal Wellbeing:  Category I Pain Control:  Labor support without medications I/D:   Afebrile, GBS neg, Intact  Anticipated MOD:  NSVD  , CNM 03/14/2022, 10:24 AM

## 2022-03-14 NOTE — Anesthesia Preprocedure Evaluation (Signed)
Anesthesia Evaluation  Patient identified by MRN, date of birth, ID band Patient awake    Reviewed: Allergy & Precautions, H&P , NPO status , Patient's Chart, lab work & pertinent test results, reviewed documented beta blocker date and time   History of Anesthesia Complications Negative for: history of anesthetic complications  Airway Mallampati: II  TM Distance: >3 FB Neck ROM: full    Dental  (+) Dental Advidsory Given, Teeth Intact   Pulmonary neg pulmonary ROS,    Pulmonary exam normal breath sounds clear to auscultation       Cardiovascular Exercise Tolerance: Good negative cardio ROS Normal cardiovascular exam Rhythm:regular Rate:Normal     Neuro/Psych negative neurological ROS  negative psych ROS   GI/Hepatic Neg liver ROS, GERD  ,  Endo/Other  diabetes  Renal/GU negative Renal ROS  negative genitourinary   Musculoskeletal   Abdominal   Peds  Hematology negative hematology ROS (+)   Anesthesia Other Findings Past Medical History: No date: Gestational diabetes   Reproductive/Obstetrics negative OB ROS                             Anesthesia Physical Anesthesia Plan  ASA: 2  Anesthesia Plan: Epidural   Post-op Pain Management:    Induction:   PONV Risk Score and Plan:   Airway Management Planned:   Additional Equipment:   Intra-op Plan:   Post-operative Plan:   Informed Consent: I have reviewed the patients History and Physical, chart, labs and discussed the procedure including the risks, benefits and alternatives for the proposed anesthesia with the patient or authorized representative who has indicated his/her understanding and acceptance.     Dental Advisory Given  Plan Discussed with: Anesthesiologist, CRNA and Surgeon  Anesthesia Plan Comments:         Anesthesia Quick Evaluation

## 2022-03-15 ENCOUNTER — Encounter: Payer: Self-pay | Admitting: Obstetrics and Gynecology

## 2022-03-15 LAB — GLUCOSE, CAPILLARY
Glucose-Capillary: 104 mg/dL — ABNORMAL HIGH (ref 70–99)
Glucose-Capillary: 79 mg/dL (ref 70–99)
Glucose-Capillary: 92 mg/dL (ref 70–99)

## 2022-03-15 MED ORDER — WITCH HAZEL-GLYCERIN EX PADS
1.0000 | MEDICATED_PAD | CUTANEOUS | Status: DC | PRN
  Filled 2022-03-15: qty 100

## 2022-03-15 MED ORDER — COCONUT OIL OIL: 1.0000 | TOPICAL_OIL | Status: DC | PRN

## 2022-03-15 MED ORDER — OXYCODONE HCL 5 MG PO TABS: 5.0000 mg | ORAL_TABLET | ORAL | Status: DC | PRN

## 2022-03-15 MED ORDER — DIPHENHYDRAMINE HCL 25 MG PO CAPS: 25.0000 mg | ORAL_CAPSULE | Freq: Four times a day (QID) | ORAL | Status: DC | PRN

## 2022-03-15 MED ORDER — SODIUM CHLORIDE 0.9% FLUSH: 3.0000 mL | INTRAVENOUS | Status: DC | PRN

## 2022-03-15 MED ORDER — IBUPROFEN 100 MG/5ML PO SUSP
600.0000 mg | Freq: Four times a day (QID) | ORAL | Status: DC | PRN
  Administered 2022-03-16 – 2022-03-17 (×5): 600 mg via ORAL
  Filled 2022-03-15 (×8): qty 30

## 2022-03-15 MED ORDER — FLEET ENEMA 7-19 GM/118ML RE ENEM: 1.0000 | ENEMA | Freq: Every day | RECTAL | Status: DC | PRN

## 2022-03-15 MED ORDER — TETANUS-DIPHTH-ACELL PERTUSSIS 5-2.5-18.5 LF-MCG/0.5 IM SUSY
0.5000 mL | PREFILLED_SYRINGE | Freq: Once | INTRAMUSCULAR | Status: DC
  Filled 2022-03-15: qty 0.5

## 2022-03-15 MED ORDER — ACETAMINOPHEN 325 MG PO TABS
650.0000 mg | ORAL_TABLET | ORAL | Status: DC | PRN
  Filled 2022-03-15: qty 2

## 2022-03-15 MED ORDER — IBUPROFEN 600 MG PO TABS
ORAL_TABLET | ORAL | Status: AC
  Filled 2022-03-15: qty 1

## 2022-03-15 MED ORDER — SODIUM CHLORIDE 0.9% FLUSH
3.0000 mL | Freq: Two times a day (BID) | INTRAVENOUS | Status: DC
  Administered 2022-03-15: 3 mL via INTRAVENOUS

## 2022-03-15 MED ORDER — KETOROLAC TROMETHAMINE 30 MG/ML IJ SOLN
30.0000 mg | Freq: Once | INTRAMUSCULAR | Status: AC
  Administered 2022-03-15: 30 mg via INTRAVENOUS

## 2022-03-15 MED ORDER — ZOLPIDEM TARTRATE 5 MG PO TABS: 5.0000 mg | ORAL_TABLET | Freq: Every evening | ORAL | Status: DC | PRN

## 2022-03-15 MED ORDER — SENNOSIDES 8.8 MG/5ML PO SYRP
10.0000 mL | ORAL_SOLUTION | Freq: Every day | ORAL | Status: DC
  Filled 2022-03-15: qty 10

## 2022-03-15 MED ORDER — MEASLES, MUMPS & RUBELLA VAC IJ SOLR
0.5000 mL | Freq: Once | INTRAMUSCULAR | Status: DC
  Filled 2022-03-15: qty 0.5

## 2022-03-15 MED ORDER — METHYLERGONOVINE MALEATE 0.2 MG/ML IJ SOLN
INTRAMUSCULAR | Status: AC
  Administered 2022-03-15: 0.2 mg
  Filled 2022-03-15: qty 1

## 2022-03-15 MED ORDER — IBUPROFEN 600 MG PO TABS: 600.0000 mg | ORAL_TABLET | Freq: Four times a day (QID) | ORAL | Status: DC

## 2022-03-15 MED ORDER — BISACODYL 10 MG RE SUPP: 10.0000 mg | Freq: Every day | RECTAL | Status: DC | PRN

## 2022-03-15 MED ORDER — ONDANSETRON HCL 4 MG PO TABS: 4.0000 mg | ORAL_TABLET | ORAL | Status: DC | PRN

## 2022-03-15 MED ORDER — PRENATAL MULTIVITAMIN CH
1.0000 | ORAL_TABLET | Freq: Every day | ORAL | Status: DC
Start: 2022-03-16 — End: 2022-03-17

## 2022-03-15 MED ORDER — OXYCODONE HCL 20 MG/ML PO CONC: 5.0000 mg | ORAL | Status: DC | PRN

## 2022-03-15 MED ORDER — SODIUM CHLORIDE 0.9 % IV SOLN
INTRAVENOUS | Status: AC
  Filled 2022-03-15: qty 5

## 2022-03-15 MED ORDER — ACETAMINOPHEN 160 MG/5ML PO SOLN: 650.0000 mg | ORAL | Status: DC | PRN

## 2022-03-15 MED ORDER — BUPIVACAINE HCL (PF) 0.25 % IJ SOLN
INTRAMUSCULAR | Status: DC | PRN
  Administered 2022-03-15 (×2): 4 mL via EPIDURAL

## 2022-03-15 MED ORDER — SODIUM CHLORIDE 0.9 % IV SOLN: 250.0000 mL | INTRAVENOUS | Status: DC | PRN

## 2022-03-15 MED ORDER — BENZOCAINE-MENTHOL 20-0.5 % EX AERO
1.0000 | INHALATION_SPRAY | CUTANEOUS | Status: DC | PRN
  Filled 2022-03-15 (×2): qty 56

## 2022-03-15 MED ORDER — ONDANSETRON HCL 4 MG/2ML IJ SOLN: 4.0000 mg | INTRAMUSCULAR | Status: DC | PRN

## 2022-03-15 MED ORDER — SIMETHICONE 80 MG PO CHEW: 80.0000 mg | CHEWABLE_TABLET | ORAL | Status: DC | PRN

## 2022-03-15 MED ORDER — KETOROLAC TROMETHAMINE 30 MG/ML IJ SOLN
INTRAMUSCULAR | Status: AC
  Filled 2022-03-15: qty 1

## 2022-03-15 MED ORDER — SODIUM CHLORIDE 0.9 % IV SOLN
500.0000 mg | INTRAVENOUS | Status: DC
  Administered 2022-03-15: 500 mg via INTRAVENOUS

## 2022-03-15 MED ORDER — SENNOSIDES-DOCUSATE SODIUM 8.6-50 MG PO TABS
2.0000 | ORAL_TABLET | ORAL | Status: DC
  Administered 2022-03-16: 2 via ORAL
  Filled 2022-03-15: qty 2

## 2022-03-15 MED ORDER — DIBUCAINE (PERIANAL) 1 % EX OINT
1.0000 | TOPICAL_OINTMENT | CUTANEOUS | Status: DC | PRN
  Filled 2022-03-15: qty 28

## 2022-03-15 NOTE — Progress Notes (Signed)
Labor Progress Note  Meredith Douglas is a 31 y.o. G1P0 at [redacted]w[redacted]d by ultrasound admitted for induction of labor due to Gestational diabetes.  Subjective: feeling pressure with contractions  Objective: BP 118/65   Pulse 74   Temp 98.6 F (37 C) (Oral)   Resp 18   Ht 5\' 11"  (1.803 m)   Wt 102.1 kg   LMP 05/15/2021 (Approximate)   SpO2 97%   BMI 31.38 kg/m  Notable VS details:   Fetal Assessment: FHT:  FHR: 140 bpm, variability: moderate,  accelerations:  Present,  decelerations:  Present early  Category/reactivity:  Category I UC:   regular, every 2-4 minutes SVE:   C/C/+2 Membrane status: Arom Amniotic color: Clear  Labs: Lab Results  Component Value Date   WBC 9.3 03/14/2022   HGB 11.2 (L) 03/14/2022   HCT 35.3 (L) 03/14/2022   MCV 85.1 03/14/2022   PLT 242 03/14/2022    Assessment / Plan: Induction of labor due to gestational hypertension,  progressing well on pitocin  Labor:  progressed to C/C+2, and pushing, fetus in direct OP position. Dr 03/16/2022 notified  Preeclampsia:   N/A Fetal Wellbeing:  Category I Pain Control:  Epidural I/D:  n/a Anticipated MOD:  NSVD  Meredith Douglas Dalbert Garnet, CNM 03/15/2022, 12:29 PM

## 2022-03-15 NOTE — Progress Notes (Signed)
Labor Progress Note  Meredith Douglas is a 31 y.o. G1P0 at [redacted]w[redacted]d by ultrasound admitted for induction of labor due to Gestational diabetes.  Subjective: Pt has an epidural however she is feeling contractions on her left side  Objective: BP 115/61   Pulse 68   Temp 98.4 F (36.9 C) (Axillary)   Resp 17   Ht 5\' 11"  (1.803 m)   Wt 102.1 kg   LMP 05/15/2021 (Approximate)   SpO2 97%   BMI 31.38 kg/m    Fetal Assessment: FHT:  FHR: 135 bpm, variability: moderate,  accelerations:  Present,  decelerations:  Absent  Category/reactivity:  Category I UC:   regular, every 2-3 min SVE:    Dilation: 5cm  Effacement: 90%  Station:  -1, 0  Consistency: soft  Position: anterior  Membrane status: AROM at 0600 Amniotic color: clear  Labs: Lab Results  Component Value Date   WBC 9.3 03/14/2022   HGB 11.2 (L) 03/14/2022   HCT 35.3 (L) 03/14/2022   MCV 85.1 03/14/2022   PLT 242 03/14/2022    Assessment / Plan: Induction of labor due to gestational diabetes, progressing on pitocin  Pitocin at 64mU  Labor: Progressing normally Preeclampsia:   115/61 Fetal Wellbeing:  Category I  Pain Control:  Epidural  I/D:   Afebrile, GBS neg, AROM x0 hrs  Anticipated MOD:  NSVD  12m, CNM 03/15/2022, 6:31 AM

## 2022-03-15 NOTE — Plan of Care (Signed)
Transferred to Room 343 from L&D. Alert and oriented with pleasant affect. Color good, skin w&d. Assessment and VS WNL. Oriented to Room, Safety and Security, Fall Prevention and [employer] initiated.

## 2022-03-15 NOTE — Discharge Summary (Signed)
Obstetrical Discharge Summary  Patient Name: Meredith Douglas DOB: 30-Sep-1990 MRN: [id number]  Date of Admission: 03/14/2022 Date of Delivery: 03/15/2022 Delivered by: Dr Meredith Douglas Date of Discharge: 03/15/2022  [facility]: [facility] [id number] last menstrual period was 05/15/2021 (approximate). EDC Estimated Date of Delivery: 03/14/22 Gestational Age at Delivery: [redacted]w[redacted]d   Antepartum complications:  1. A1 GDM RH negative Rubella non immune  Admitting Diagnosis: Encounter for planned induction of labor [Z34.90]  Secondary Diagnosis: Patient Active Problem List   Diagnosis Date Noted   Encounter for planned induction of labor 03/14/2022   Acute left flank pain 01/10/2022   Diet controlled gestational diabetes mellitus (GDM), antepartum 01/07/2022   Rubella non-immune status, antepartum 10/20/2021   Rh negative status during pregnancy in second trimester 10/20/2021   Encounter for supervision of normal first pregnancy in third trimester 07/24/2021    Augmentation: AROM and Pitocin Complications: None Intrapartum complications/course: IOL for GDM, Patient progressed to c/c+2 baby noted to be direct OP, baby turned by MD. Pecola Leisure delivered OA over a 3rd degree lac. Placenta manually extracted along with multiple clots. IM methergine given for bleeding. Patient given 1 gm Azithromycin for uterine exploration.  Delivery Type: spontaneous vaginal delivery Anesthesia: epidural Placenta: spontaneous Laceration: 3 rd degree Episiotomy: none Newborn Data: Live born female :"Meredith Douglas" Birth Weight: 10 lb 0.1 oz (4540 g) APGAR: 8, 9  Newborn Delivery   Birth date/time: 03/15/2022 13:58:00 Delivery type: Vaginal, Spontaneous        Postpartum Procedures: *** [place]:      No data to display           Post partum course: *** Patient had an uncomplicated postpartum course.  By time of discharge on PPD#***, her pain was controlled on oral pain medications; she had  appropriate lochia and was ambulating, voiding without difficulty and tolerating regular diet.  She was deemed stable for discharge to home.    Discharge Physical Exam: *** BP 135/73   Pulse 85   Temp 98.6 F (37 C) (Oral)   Resp 18   Ht 5\' 11"  (1.803 m)   Wt 102.1 kg   LMP 05/15/2021 (Approximate)   SpO2 97%   BMI 31.38 kg/m   General: NAD CV: RRR Pulm: CTABL, nl effort ABD: s/nd/nt, fundus firm and below the umbilicus Lochia: moderate Perineum:***minimal edema/{OB Perineal assessment:24215} DVT Evaluation: LE non-ttp, no evidence of DVT on exam.  Hemoglobin  Date Value Ref Range Status  03/14/2022 11.2 (L) 12.0 - 15.0 g/dL Final   HCT  Date Value Ref Range Status  03/14/2022 35.3 (L) 36.0 - 46.0 % Final     Disposition: stable, discharge to home. Baby Feeding: breastmilk Baby Disposition: home with mom  Rh Immune globulin given: *** Rubella vaccine given:  *** Varivax vaccine given: N/A Flu vaccine given in AP or PP setting: declined Tdap vaccine given in AP or PP setting: declined  Contraception: LAM  Prenatal Labs:  Blood type/Rh A neg  Antibody screen neg  Rubella Non-Immune  Varicella Immune  RPR NR  HBsAg Neg  Hep C NR  HIV NR  GC neg  Chlamydia neg  Genetic screening cfDNA declined  1 hour GTT 212  3 hour GTT N/a  GBS neg    Plan:  03/16/2022 Meredith Douglas was discharged to home in good condition. Follow-up appointment with delivering provider in 6 weeks.  Discharge Medications: Allergies as of 03/15/2022   Not on File   Med Rec must be completed prior to using this Meredith Douglas***  Signed: *** Hit refresh and delete this line

## 2022-03-15 NOTE — Progress Notes (Signed)
Meredith Douglas is a 31 y.o. G1P0 at [redacted]w[redacted]d -called to the room and found direct OP. Turned baby to LOA without complication. Maternal pushing uncomplicated   Objective: BP 118/65   Pulse 74   Temp 98.6 F (37 C) (Oral)   Resp 18   Ht 5\' 11"  (1.803 m)   Wt 102.1 kg   LMP 05/15/2021 (Approximate)   SpO2 97%   BMI 31.38 kg/m  No intake/output data recorded. Total I/O In: -  Out: 800 [Urine:800]  SVE:   Dilation: 10 Effacement (%): 100 Station: -1, 0 Exam by:: [email or web address]: Lab Results  Component Value Date   WBC 9.3 03/14/2022   HGB 11.2 (L) 03/14/2022   HCT 35.3 (L) 03/14/2022   MCV 85.1 03/14/2022   PLT 242 03/14/2022    Assessment / Plan:  Anticipated MOD:  NSVD  03/16/2022 03/15/2022, 1:00 PM

## 2022-03-16 LAB — CBC
HCT: 31.1 % — ABNORMAL LOW (ref 36.0–46.0)
Hemoglobin: 9.8 g/dL — ABNORMAL LOW (ref 12.0–15.0)
MCH: 26.7 pg (ref 26.0–34.0)
MCHC: 31.5 g/dL (ref 30.0–36.0)
MCV: 84.7 fL (ref 80.0–100.0)
Platelets: 201 10*3/uL (ref 150–400)
RBC: 3.67 MIL/uL — ABNORMAL LOW (ref 3.87–5.11)
RDW: 13.3 % (ref 11.5–15.5)
WBC: 16.9 10*3/uL — ABNORMAL HIGH (ref 4.0–10.5)
nRBC: 0 % (ref 0.0–0.2)

## 2022-03-16 LAB — FETAL SCREEN: Fetal Screen: NEGATIVE

## 2022-03-16 LAB — ABO/RH
ABO/RH(D): A NEG
Weak D: NEGATIVE

## 2022-03-16 MED ORDER — RHO D IMMUNE GLOBULIN 1500 UNIT/2ML IJ SOSY
300.0000 ug | PREFILLED_SYRINGE | Freq: Once | INTRAMUSCULAR | Status: AC
Start: 2022-03-16 — End: 2022-03-16
  Administered 2022-03-16: 300 ug via INTRAVENOUS
  Filled 2022-03-16: qty 2

## 2022-03-16 NOTE — Progress Notes (Signed)
Postpartum Day  1  Subjective: no complaints, up ad lib, voiding, tolerating PO, and + flatus  Doing well, no concerns. Ambulating without difficulty, pain managed with PO meds, tolerating regular diet, and voiding without difficulty.   No fever/chills, chest pain, shortness of breath, nausea/vomiting, or leg pain. No nipple or breast pain. No headache, visual changes, or RUQ/epigastric pain.  Objective: BP 112/81 (BP Location: Left Arm)   Pulse 98   Temp 98.4 F (36.9 C) (Oral)   Resp 20   Ht 5\' 11"  (1.803 m)   Wt 102.1 kg   LMP 05/15/2021 (Approximate)   SpO2 97%   Breastfeeding Unknown   BMI 31.38 kg/m    Physical Exam:  General: alert, cooperative, and appears stated age Breasts: soft/nontender CV: RRR Pulm: nl effort, CTABL Abdomen: soft, non-tender, active bowel sounds Uterine Fundus: firm Perineum: mild edema, laceration hemostatic, repair well approximated Lochia: appropriate DVT Evaluation: No evidence of DVT seen on physical exam. Negative Homan's sign. No cords or calf tenderness. No significant calf/ankle edema.  Recent Labs    03/14/22 0032 03/16/22 0629  HGB 11.2* 9.8*  HCT 35.3* 31.1*  WBC 9.3 16.9*  PLT 242 201    Assessment/Plan: 31 y.o. G1P1001 postpartum day # 1  -Continue routine postpartum care -Lactation consult PRN for breastfeeding -Discussed contraceptive options including implant, IUDs hormonal and non-hormonal, injection, pills/ring/patch, condoms, and NFP.  -Acute blood loss anemia - hemodynamically stable and asymptomatic; start PO ferrous sulfate BID with stool softeners  -Immunization status:   all immunizations up to date - Desires POP's  Disposition: Continue inpatient postpartum care   LOS: 2 days   Meredith Douglas 03/18/22, CNM 03/16/2022, 12:18 PM   ----- 03/18/2022 Certified Nurse Midwife [facility]

## 2022-03-16 NOTE — Progress Notes (Addendum)
Katharina Caper CNM notified at [facility] that Pt. Had not voided since delivery. I also informed her that Pt.'s Fundus is at U/E and that there is small Lochial Flow. Pt. Denied discomfort at this time. I Bladder scanned her and there was over 300cc of Urine in bladder. I reported all of this to [facility] NNP and she ordered that an I/O Cath. Be done as per standing order and then Pt. Void in 2 hours and preform a Post void Bladder Scan.Pt. was Breast Feeding. At 0050 Pt. Voided 200 cc Dark amber Urine with Lochia. I Bladder scanned her and there was 346cc of Urine by Scan. I I/O Catheterized the Pt. Using Sterile Tech. At 0110 and 450cc of dark Amber Urine was removed from Bladder. I reinforced  to Pt. That she needed to drink one more Pitcher of water and she v/o. Will have her void at 0310 and rescan her Post void as per order.

## 2022-03-16 NOTE — Progress Notes (Signed)
Pt. Voided 75cc clear amber urine. Bladder Scanned and >300cc noted via Bladder scan. Will notify Katharina Caper CNM

## 2022-03-16 NOTE — Progress Notes (Signed)
Katharina Caper CNM notified that Pt. Voided 75cc of clear urine and then was Bladder scanned. Bladder scan showed > 300cc of Urine in Bladder. Pt. Denies discomfort and Fundus is firm at U/E and Small Lochial Flow. Rubye Oaks CNM did not give any further orders except for her to be notified if Pt. Begins feeling uncomfortable from inability to void. Pt. Insstructed and v/o.

## 2022-03-17 LAB — RHOGAM INJECTION: Unit division: 0

## 2022-03-17 MED ORDER — IBUPROFEN 100 MG/5ML PO SUSP: 600.0000 mg | Freq: Four times a day (QID) | ORAL | Status: AC | PRN

## 2022-03-17 MED ORDER — BENZOCAINE-MENTHOL 20-0.5 % EX AERO: 1.0000 | INHALATION_SPRAY | CUTANEOUS | Status: AC | PRN

## 2022-03-17 MED ORDER — ACETAMINOPHEN 160 MG/5ML PO SOLN: 650.0000 mg | ORAL | 0 refills | Status: AC | PRN

## 2022-03-17 MED ORDER — WITCH HAZEL-GLYCERIN EX PADS: 1.0000 | MEDICATED_PAD | CUTANEOUS | 12 refills | Status: AC | PRN

## 2022-03-17 NOTE — Anesthesia Postprocedure Evaluation (Signed)
Anesthesia Post Note  Patient: Meredith Douglas  Procedure(s) Performed: AN AD HOC LABOR EPIDURAL  Patient location during evaluation: Mother Baby Anesthesia Type: Epidural Level of consciousness: awake and alert Pain management: pain level controlled Vital Signs Assessment: post-procedure vital signs reviewed and stable Respiratory status: spontaneous breathing, nonlabored ventilation and respiratory function stable Cardiovascular status: stable Postop Assessment: no headache, no backache and epidural receding Anesthetic complications: no   No notable events documented.   Last Vitals:  Vitals:   03/16/22 1920 03/16/22 2317  BP: 121/76 131/77  Pulse: 90 94  Resp: 20 20  Temp: 36.8 C 36.9 C  SpO2: 100% 98%    Last Pain:  Vitals:   03/17/22 0635  TempSrc:   PainSc: 3                  Jules Schick

## 2022-03-17 NOTE — Progress Notes (Signed)
Discharge instructions, prescriptions, education, and appointments given and explained. Pt verbalized understanding with no further questions. Pt wheeled to personal vehicle via staff for d/c home

## 2022-03-17 NOTE — Lactation Note (Signed)
This note was copied from a baby's chart. Lactation Consultation Note  Patient Name: Meredith Douglas [id number] Date: 03/17/2022 Reason for consult: Initial assessment;Primapara;Term;Other (Comment) (LGA) Age:29 hours  Maternal Data Has patient been taught Hand Expression?: Yes Does the patient have breastfeeding experience prior to this delivery?: No  Feeding Mother's Current Feeding Choice: Breast Milk  Baby has been feeding, voiding, and stooling well. No overall pain/discomfort noted with feedings; mom only reporting what seems to be transient nipple tenderness.   LATCH Score  Lactation Tools Discussed/Used    Interventions Interventions: Breast feeding basics reviewed;Breast massage;Pre-pump if needed;Reverse pressure;Coconut oil;Hand pump;Ice;Education  Reviewed normal newborn feeding patterns and behaviors, cluster feeding, growth spurts, deep latch, signs of adequate transfer, proper positioning and alignment. Mom does not plan to return to work until 4 months PP; discussed basic pumping introduction or casual pump use.  Encouraged the delay of bottles/pacifiers until breastfeeding well established. Discussed the changes in breast tissue over the course of the next week; possible need to soften breast prior to latch and the use of ice for swelling.  Discharge Discharge Education: Engorgement and breast care;Warning signs for feeding baby;Outpatient recommendation Pump: Personal (No return to work until 4 months PP) Encouraged to continue tracking diapers/output, breast care guidance given.  Consult Status Consult Status: Complete  [facility] service information given.  Danford Bad 03/17/2022, 9:30 AM

## 2022-03-19 LAB — TYPE AND SCREEN
ABO/RH(D): A NEG
Antibody Screen: POSITIVE

## 2022-05-01 ENCOUNTER — Encounter: Payer: Self-pay | Admitting: Physical Therapy

## 2022-05-01 ENCOUNTER — Ambulatory Visit: Payer: BC Managed Care – PPO | Attending: Obstetrics | Admitting: Physical Therapy

## 2022-05-01 DIAGNOSIS — M6281 Muscle weakness (generalized): Secondary | ICD-10-CM | POA: Diagnosis present

## 2022-05-01 DIAGNOSIS — R278 Other lack of coordination: Secondary | ICD-10-CM | POA: Diagnosis present

## 2022-05-01 DIAGNOSIS — R102 Pelvic and perineal pain: Secondary | ICD-10-CM | POA: Diagnosis present

## 2022-05-01 NOTE — Therapy (Signed)
OUTPATIENT PHYSICAL THERAPY FEMALE PELVIC EVALUATION   Patient Name: Meredith Douglas MRN: [id number] DOB:Dec 31, 1990, 31 y.o., female Today's Date: 05/01/2022   PT End of Session - 05/01/22 1428     Visit Number 1    Number of Visits 12    Date for PT Re-Evaluation 07/24/22    Authorization Type IE 05/01/2022    PT Start Time 1430    PT Stop Time 1510    PT Time Calculation (min) 40 min    Activity Tolerance Patient tolerated treatment well    Behavior During Therapy [facility] for tasks assessed/performed             Past Medical History:  Diagnosis Date   Gestational diabetes    History reviewed. No pertinent surgical history. Patient Active Problem List   Diagnosis Date Noted   Encounter for planned induction of labor 03/14/2022   Acute left flank pain 01/10/2022   Diet controlled gestational diabetes mellitus (GDM), antepartum 01/07/2022   Rubella non-immune status, antepartum 10/20/2021   Rh negative status during pregnancy in second trimester 10/20/2021   Encounter for supervision of normal first pregnancy in third trimester 07/24/2021    PCP: Pcp, No  REFERRING PROVIDER: Tomasita Morrow*  REFERRING DIAG: R10.2 (ICD-10-CM) - Pelvic and perineal pain   THERAPY DIAG:  Pelvic pain  Other lack of coordination  Muscle weakness (generalized)  Rationale for Evaluation and Treatment Rehabilitation  PRECAUTIONS: None  WEIGHT BEARING RESTRICTIONS No  FALLS:  Has patient fallen in last 6 months? No  ONSET DATE: 03/15/2022  SUBJECTIVE:                                                                                                                                                                                           CHIEF COMPLAINT: Patient states that it has been a roller coaster since delivery. She has also had an infection since then. Patient notes that while still in the hospital she was cath'd for urination because she was unable to void. Patient notes  that she has intense pressure and pain after voiding now but is able to counterpressure with toilet tissue to offer some relief. Patient does encounter some UI but this is typically post-void or just when at rest. Patient does endorse a high level of fear around returning to sexual activity.   PERTINENT HISTORY/CHART REVIEW:  Red flags (bowel/bladder changes, saddle paresthesia, personal history of cancer, h/o spinal tumors, h/o compression fx, h/o abdominal aneurysm, abdominal pain, chills/fever, night sweats, nausea, vomiting, unrelenting pain, first onset of insidious LBP <20 y/o): Negative   PAIN:  Are you having pain? Yes NPRS scale:  0/10 (current) 3-4/10 (average with typical fillign of bladder) 8/10 (worst) (full bladder, finish urinating);  Pain location: bladder, urethra Pain type: intermittent Pain descriptors: pressure, sharp Pain duration: 15-20 sec unless passing a stool in which the pain will last until the bowels are emptied  Aggravating factors: full bladder, urination Relieving factors: counterpressure,    OCCUPATION/LEISURE ACTIVITIES:  Walking, running, hiking,   PLOF:  Independent  PATIENT GOALS: Alleviate the pain that I am feeling when I go to the bathroom  OBSTETRICAL HISTORY: G1P1 Deliveries: SVD Tearing/Episiotomy: 3rd degree Birthing position: dorsal lithotomy  GYNECOLOGICAL HISTORY: Hysterectomy: No  Endometriosis: Negative Last Menstrual Period:  Pain with exam: Yes  Prolapse: None Heaviness/pressure: No   UROLOGICAL HISTORY: Frequency of urination: every 2 hours Incontinence:  post-void, no apparent reason  Onset: 02/2022 Amount: Min. Protective undergarments: No   Fluid Intake: 120-160 oz H20, 3x/week coffee caffeinated, 0-1/day seltzers Nocturia: 1x/night Toileting posture: feet flat Incomplete emptying: Yes  Pain with urination: Positive for terminating urination and when bladder is completely full Stream: Strong and  Weak Urgency: No  Difficulty initiating urination: Positive for with full bladder Intermittent stream: Positive for initially; negative at present Frequent UTI: Negative.   GASTROINTESTINAL HISTORY: Childhood history of constipation.  Type of bowel movement: (Bristol Stool Scale) 2,3 without Miralax, 4 with Miralax Frequency of BMs: every other day without Miralax, multiple times/day with Miralax Incomplete bowel movement: No  Pain with defecation: Positive Straining with defecation: Positive for 40% Hemorrhoids: Negative ; internal/external; active/latent Toileting posture: feet flat Fiber supplement: No  Incontinence: Negative.    SEXUAL HISTORY AND FUNCTION: Sexually active: No  Pain with penetration: depending on position   OBJECTIVE:  DIAGNOSTIC TESTING/IMAGING: none  COGNITION:  Patient is oriented to person, place, and time.  Recent memory is intact.  Remote memory is intact.  Attention span and concentration are intact.  Expressive speech is intact.  Patient's fund of knowledge is within normal limits for educational level.    POSTURE/OBSERVATIONS:   Lumbar lordosis: WNL Thoracic kyphosis: WNL Iliac crest height: not formally assessed  Lumbar lateral shift: not formally assessed  Pelvic obliquity: not formally assessed  Leg length discrepancy: not formally assessed    GAIT:  Grossly WFL.    RANGE OF MOTION: deferred 2/2 to time constraints  AROM (Normal range in degrees) AROM  05/01/2022  Lumbar   Flexion (65)   Extension (30)   Right lateral flexion (25)   Left lateral flexion (25)   Right rotation (30)   Left rotation (30)       Hip LEFT RIGHT  Flexion (125)    Extension (15)    Abduction (40)    Adduction     Internal Rotation (45)    External Rotation (45)    (* = pain; blank rows = not tested)   SENSATION: deferred 2/2 to time constraints  Grossly intact to light touch bilateral LEs as determined by testing dermatomes  L2-S2 Proprioception and hot/cold testing deferred on this date   STRENGTH: MMT deferred 2/2 to time constraints   RLE LLE  Hip Flexion    Hip Extension    Hip Abduction     Hip Adduction     Hip ER     Hip IR     Knee Extension    Knee Flexion    Dorsiflexion     Plantarflexion (seated)    (* = pain; blank rows = not tested)   MUSCLE LENGTH: deferred 2/2 to time  constraints   ABDOMINAL: deferred 2/2 to time constraints  Palpation: Diastasis: Scar mobility: Rib flare:   SPECIAL TESTS: deferred 2/2 to time constraints    PALPATION: deferred 2/2 to time constraints  LOCATION LEFT  RIGHT           Lumbar paraspinals    Quadratus Lumborum    Gluteus Maximus    Gluteus Medius    Deep hip external rotators    PSIS    Fortin's Area (SIJ)    Greater Trochanter    ASIS    Sacral border    Coccyx    Ischial tuberosity    (blank rows = not tested) Graded on 0-4 scale (0 = no pain, 1 = pain, 2 = pain with wincing/grimacing/flinching, 3 = pain with withdrawal, 4 = unwilling to allow palpation)   PHYSICAL PERFORMANCE MEASURES:  STS: WNL Deep Squat: RLE STS: LLE STS:  : 5TSTS:     EXTERNAL PELVIC EXAM: deferred 2/2 to time constraints None given; testing deferred to later date  Breath coordination: Voluntary Contraction: present/absent Relaxation: full/delayed/non-relaxing Perineal movement with sustained IAP increase ("bear down"): descent/no change/elevation/excessive descent Perineal movement with rapid IAP increase ("cough"): elevation/no change/descent Palpation of bulbocavernosus: Palpation of ischiocavernosus: Palpation of pubic symphysis: Palpation of superficial transverse perineal:   PATIENT EDUCATION:  Patient educated on prognosis, POC, and provided with HEP including: techniques for complete emptying. Patient articulated understanding and returned demonstration. Patient will benefit from further education in order to maximize compliance  and understanding for long-term therapeutic gains.   PATIENT SURVEYS:  FOTO PFDI Pain 13; Urinary Problem 66  ASSESSMENT:  Clinical Impression: Patient is a 31 year old presenting to clinic with chief complaints of dysuria and pelvic pain. Today's evaluation is suggestive of deficits in PFM coordination, PFM strength, IAP management, and postural control as evidenced by post-void UI, 8/10 pain with voiding, straining with BMs/urge to strain, voiding every 2 hours, and urinary hesitancy with delayed urge/bladder filling. Patient's responses on FOTO outcome measures (PFDI Pain 13, Urinary Problem 66) indicate significant functional limitations/disability/distress. Patient's progress may be limited due to demands of caregiving; however, patient's motivation is advantageous. Patient was able to achieve adequate understanding of toileting posture techniques for improved emptying during today's evaluation and responded positively to educational interventions. Patient will benefit from continued skilled therapeutic intervention to address deficits in PFM coordination, PFM strength, IAP management, and postural control in order to increase function and improve overall QOL.   Objective impairments: decreased activity tolerance, decreased coordination, decreased endurance, decreased strength, improper body mechanics, postural dysfunction, and pain.   Activity limitations: interpersonal relationship and toileting .   Personal factors: Age, Past/current experiences, and Time since onset of injury/illness/exacerbation are also affecting patient's functional outcome.   Rehab Potential: Good  Clinical decision making: Evolving/moderate complexity  Evaluation complexity: Moderate   GOALS: Goals reviewed with patient? Yes  LONG TERM GOALS: Target date: 07/24/2022  Patient will demonstrate improved function as evidenced by a score of 77 on FOTO measure for full participation in activities at home and in  the community.  Baseline: 66 Goal status: INITIAL  Patient will demonstrate independence with HEP in order to maximize therapeutic gains and improve carryover from physical therapy sessions to ADLs in the home and community. Baseline: not initiated Goal status: INITIAL  Patient will demonstrate improved function as evidenced by a score of 0 on PFDI Pain measure for full participation in activities at home and in the community.  Baseline: 13 Goal status:  INITIAL  Patient will report being able to return to activities including, but not limited to: partner intimacy and running without pain or limitation to indicate complete resolution of the chief complaint and return to prior level of participation at home and in the community. Baseline: unable Goal status: INITIAL   PLAN: Rehab frequency: 1x/week  Rehab duration: 12 weeks  Planned interventions: Therapeutic exercises, Therapeutic activity, Neuromuscular re-education, Balance training, Gait training, Patient/Family education, Self Care, Joint mobilization, Electrical stimulation, Spinal mobilization, Cryotherapy, Moist heat, scar mobilization, Taping, and Manual therapy     Sheria Lang PT, DPT [phone]  05/01/2022, 3:59 PM

## 2022-05-06 ENCOUNTER — Ambulatory Visit: Payer: BC Managed Care – PPO | Admitting: Physical Therapy

## 2022-05-15 ENCOUNTER — Ambulatory Visit: Payer: BC Managed Care – PPO | Admitting: Physical Therapy

## 2022-05-15 ENCOUNTER — Encounter: Payer: Self-pay | Admitting: Physical Therapy

## 2022-05-15 DIAGNOSIS — R278 Other lack of coordination: Secondary | ICD-10-CM

## 2022-05-15 DIAGNOSIS — M6281 Muscle weakness (generalized): Secondary | ICD-10-CM

## 2022-05-15 DIAGNOSIS — R102 Pelvic and perineal pain: Secondary | ICD-10-CM

## 2022-05-15 NOTE — Therapy (Signed)
OUTPATIENT PHYSICAL THERAPY FEMALE PELVIC TREATMENT   Patient Name: Meredith Douglas MRN: [id number] DOB:January 09, 1991, 31 y.o., female Today's Date: 05/15/2022   PT End of Session - 05/15/22 1646     Visit Number 2    Number of Visits 12    Date for PT Re-Evaluation 07/24/22    Authorization Type IE 05/01/2022    PT Start Time 1645    PT Stop Time 1725    PT Time Calculation (min) 40 min    Activity Tolerance Patient tolerated treatment well    Behavior During Therapy [facility] for tasks assessed/performed             Past Medical History:  Diagnosis Date   Gestational diabetes    History reviewed. No pertinent surgical history. Patient Active Problem List   Diagnosis Date Noted   Encounter for planned induction of labor 03/14/2022   Acute left flank pain 01/10/2022   Diet controlled gestational diabetes mellitus (GDM), antepartum 01/07/2022   Rubella non-immune status, antepartum 10/20/2021   Rh negative status during pregnancy in second trimester 10/20/2021   Encounter for supervision of normal first pregnancy in third trimester 07/24/2021    PCP: Pcp, No  REFERRING PROVIDER: Tomasita Morrow*  REFERRING DIAG: R10.2 (ICD-10-CM) - Pelvic and perineal pain   THERAPY DIAG:  Pelvic pain  Other lack of coordination  Muscle weakness (generalized)  Rationale for Evaluation and Treatment Rehabilitation  PRECAUTIONS: None  WEIGHT BEARING RESTRICTIONS No  FALLS:  Has patient fallen in last 6 months? No  ONSET DATE: 03/15/2022 Initial evaluation: 05/01/2022 SUBJECTIVE:                                                                                                                                                                                           CHIEF COMPLAINT: Patient states that it has been a roller coaster since delivery. She has also had an infection since then. Patient notes that while still in the hospital she was cath'd for urination because she was  unable to void. Patient notes that she has intense pressure and pain after voiding now but is able to counterpressure with toilet tissue to offer some relief. Patient does encounter some UI but this is typically post-void or just when at rest. Patient does endorse a high level of fear around returning to sexual activity.   PERTINENT HISTORY/CHART REVIEW:  Red flags (bowel/bladder changes, saddle paresthesia, personal history of cancer, h/o spinal tumors, h/o compression fx, h/o abdominal aneurysm, abdominal pain, chills/fever, night sweats, nausea, vomiting, unrelenting pain, first onset of insidious LBP <20 y/o): Negative   PAIN:  Are you having pain? Yes  NPRS scale:  0/10 (current) 3-4/10 (average with typical fillign of bladder) 8/10 (worst) (full bladder, finish urinating);  Pain location: bladder, urethra Pain type: intermittent Pain descriptors: pressure, sharp Pain duration: 15-20 sec unless passing a stool in which the pain will last until the bowels are emptied  Aggravating factors: full bladder, urination Relieving factors: counterpressure,    OCCUPATION/LEISURE ACTIVITIES:  Walking, running, hiking,   PLOF:  Independent  PATIENT GOALS: Alleviate the pain that I am feeling when I go to the bathroom  OBSTETRICAL HISTORY: G1P1 Deliveries: SVD Tearing/Episiotomy: 3rd degree Birthing position: dorsal lithotomy  GYNECOLOGICAL HISTORY: Hysterectomy: No  Endometriosis: Negative Last Menstrual Period:  Pain with exam: Yes  Prolapse: None Heaviness/pressure: No   UROLOGICAL HISTORY: Frequency of urination: every 2 hours Incontinence:  post-void, no apparent reason  Onset: 02/2022 Amount: Min. Protective undergarments: No   Fluid Intake: 120-160 oz H20, 3x/week coffee caffeinated, 0-1/day seltzers Nocturia: 1x/night Toileting posture: feet flat Incomplete emptying: Yes  Pain with urination: Positive for terminating urination and when bladder is completely  full Stream: Strong and Weak Urgency: No  Difficulty initiating urination: Positive for with full bladder Intermittent stream: Positive for initially; negative at present Frequent UTI: Negative.   GASTROINTESTINAL HISTORY: Childhood history of constipation.  Type of bowel movement: (Bristol Stool Scale) 2,3 without Miralax, 4 with Miralax Frequency of BMs: every other day without Miralax, multiple times/day with Miralax Incomplete bowel movement: No  Pain with defecation: Positive Straining with defecation: Positive for 40% Hemorrhoids: Negative ; internal/external; active/latent Toileting posture: feet flat Fiber supplement: No  Incontinence: Negative.    SEXUAL HISTORY AND FUNCTION: Sexually active: No  Pain with penetration: depending on position   TREATMENT- 05/15/2022 SUBJECTIVE: Patient notes that her symptoms have been improving a little bit. She has returned to working out this week which feels great and denies any UI. Patient does still experience pain and pressure with voiding. Patient continues to report symptoms are at worst with full bladder. Patient has been doing Kegels; did 80 reps in one evening and then awoke with PFM spasm.  PAIN: Denies pain at present; full bladder 4/10  Pre-treatment assessment:  POSTURE/OBSERVATIONS:   Lumbar lordosis: WNL Thoracic kyphosis: WNL Iliac crest height: R elevated (minimally) Lumbar lateral shift: negative  Pelvic obliquity: L ASIS anterior (minimally) Leg length discrepancy: not formally assessed      RANGE OF MOTION:   AROM (Normal range in degrees) AROM  05/15/2022  Lumbar   Flexion (65) WNL  Extension (30) WNL  Right lateral flexion (25) WNL  Left lateral flexion (25) WNL  Right rotation (30) WNL  Left rotation (30) WNL      Hip LEFT RIGHT  Flexion (125) WNL WNL  Extension (15) WNL WNL  Abduction (40) WNL WNL  Adduction  WNL WNL  Internal Rotation (45) WNL WNL  External Rotation (45) WNL WNL  (* =  pain; blank rows = not tested)   STRENGTH: MMT    RLE LLE  Hip Flexion 5 5  Hip Extension 5 5  Hip Abduction  5 5  Hip Adduction  5 5  Hip ER  5 5  Hip IR  5 5  Knee Extension    Knee Flexion    Dorsiflexion     Plantarflexion (seated)    (* = pain; blank rows = not tested)   ABDOMINAL:   Palpation: no TTP Diastasis: resting: 2 finger superior, 3 finger umbilicus, 3 finger inferior; active: 1 finger superior, 2 finger  umbilicus, 2 finger inferior Scar mobility: n/a Rib flare: none noted   EXTERNAL PELVIC EXAM:  Patient educated on the purpose of the procedure/exam and articulated understanding and consented to the procedure/exam.  Breath coordination: present, inconsistent Voluntary Contraction: present Relaxation: delayed Perineal movement with sustained IAP increase ("bear down"): descent Perineal movement with rapid IAP increase ("cough"): no change  Manual Therapy:   Neuromuscular Re-education: Supine hooklying diaphragmatic breathing with VCs and TCs for downregulation of the nervous system and improved management of IAP Patient education on log roll technique, deferring crunches and planks for the time being to decrease strain on linea alba, postural considerations with holding baby, and PFM anatomy. Patient provided with HEP including wall PFM stretches: hamstrings, adductors, happy baby, and figure 4.   Therapeutic Exercise:   Treatments unbilled:  Post-treatment assessment:  Patient educated throughout session on appropriate technique and form using multi-modal cueing, HEP, and activity modification. Patient articulated understanding and returned demonstration.  Patient Response to interventions: Comfortable to return in 1 week for deep core   PATIENT SURVEYS:  FOTO PFDI Pain 13; Urinary Problem 66  ASSESSMENT:  Clinical Impression: Patient presents to clinic with excellent motivation to participate in therapy. Patient demonstrates deficits in  PFM coordination, PFM strength, IAP management, and postural control. Patient able to achieve PFM breath coordination with prolonged inhale and 2 sec breath hold during today's session and responded positively to educational interventions. Patient will benefit from continued skilled therapeutic intervention to address remaining deficits in PFM coordination, PFM strength, IAP management, and postural control in order to increase function and improve overall QOL.    Objective impairments: decreased activity tolerance, decreased coordination, decreased endurance, decreased strength, improper body mechanics, postural dysfunction, and pain.   Activity limitations: interpersonal relationship and toileting .   Personal factors: Age, Past/current experiences, and Time since onset of injury/illness/exacerbation are also affecting patient's functional outcome.   Rehab Potential: Good  Clinical decision making: Evolving/moderate complexity  Evaluation complexity: Moderate   GOALS: Goals reviewed with patient? Yes  LONG TERM GOALS: Target date: 07/24/2022  Patient will demonstrate improved function as evidenced by a score of 77 on FOTO measure for full participation in activities at home and in the community.  Baseline: 66 Goal status: INITIAL  Patient will demonstrate independence with HEP in order to maximize therapeutic gains and improve carryover from physical therapy sessions to ADLs in the home and community. Baseline: not initiated Goal status: INITIAL  Patient will demonstrate improved function as evidenced by a score of 0 on PFDI Pain measure for full participation in activities at home and in the community.  Baseline: 13 Goal status: INITIAL  Patient will report being able to return to activities including, but not limited to: partner intimacy and running without pain or limitation to indicate complete resolution of the chief complaint and return to prior level of participation at  home and in the community. Baseline: unable Goal status: INITIAL   PLAN: Rehab frequency: 1x/week  Rehab duration: 12 weeks  Planned interventions: Therapeutic exercises, Therapeutic activity, Neuromuscular re-education, Balance training, Gait training, Patient/Family education, Self Care, Joint mobilization, Electrical stimulation, Spinal mobilization, Cryotherapy, Moist heat, scar mobilization, Taping, and Manual therapy     Sheria Lang PT, DPT [phone]  05/15/2022, 4:46 PM

## 2022-05-21 ENCOUNTER — Encounter: Payer: Self-pay | Admitting: Physical Therapy

## 2022-05-21 ENCOUNTER — Ambulatory Visit: Payer: BC Managed Care – PPO | Admitting: Physical Therapy

## 2022-05-21 DIAGNOSIS — R278 Other lack of coordination: Secondary | ICD-10-CM

## 2022-05-21 DIAGNOSIS — M6281 Muscle weakness (generalized): Secondary | ICD-10-CM

## 2022-05-21 DIAGNOSIS — R102 Pelvic and perineal pain: Secondary | ICD-10-CM | POA: Diagnosis not present

## 2022-05-21 NOTE — Therapy (Signed)
OUTPATIENT PHYSICAL THERAPY FEMALE PELVIC TREATMENT   Patient Name: Meredith Douglas MRN: [id number] DOB:07/09/1991, 31 y.o., female Today's Date: 05/21/2022   PT End of Session - 05/21/22 1517     Visit Number 3    Number of Visits 12    Date for PT Re-Evaluation 07/24/22    Authorization Type IE 05/01/2022    PT Start Time 1515    PT Stop Time 1555    PT Time Calculation (min) 40 min    Activity Tolerance Patient tolerated treatment well    Behavior During Therapy [facility] for tasks assessed/performed             Past Medical History:  Diagnosis Date   Gestational diabetes    History reviewed. No pertinent surgical history. Patient Active Problem List   Diagnosis Date Noted   Encounter for planned induction of labor 03/14/2022   Acute left flank pain 01/10/2022   Diet controlled gestational diabetes mellitus (GDM), antepartum 01/07/2022   Rubella non-immune status, antepartum 10/20/2021   Rh negative status during pregnancy in second trimester 10/20/2021   Encounter for supervision of normal first pregnancy in third trimester 07/24/2021    PCP: Pcp, No  REFERRING PROVIDER: Tomasita Morrow*  REFERRING DIAG: R10.2 (ICD-10-CM) - Pelvic and perineal pain   THERAPY DIAG:  Pelvic pain  Other lack of coordination  Muscle weakness (generalized)  Rationale for Evaluation and Treatment Rehabilitation  PRECAUTIONS: None  WEIGHT BEARING RESTRICTIONS No  FALLS:  Has patient fallen in last 6 months? No  ONSET DATE: 03/15/2022 Initial evaluation: 05/01/2022 SUBJECTIVE:                                                                                                                                                                                           CHIEF COMPLAINT: Patient states that it has been a roller coaster since delivery. She has also had an infection since then. Patient notes that while still in the hospital she was cath'd for urination because she was  unable to void. Patient notes that she has intense pressure and pain after voiding now but is able to counterpressure with toilet tissue to offer some relief. Patient does encounter some UI but this is typically post-void or just when at rest. Patient does endorse a high level of fear around returning to sexual activity.   PERTINENT HISTORY/CHART REVIEW:  Red flags (bowel/bladder changes, saddle paresthesia, personal history of cancer, h/o spinal tumors, h/o compression fx, h/o abdominal aneurysm, abdominal pain, chills/fever, night sweats, nausea, vomiting, unrelenting pain, first onset of insidious LBP <22 y/o): Negative   PAIN:  Are you having pain? Yes  NPRS scale:  0/10 (current) 3-4/10 (average with typical fillign of bladder) 8/10 (worst) (full bladder, finish urinating);  Pain location: bladder, urethra Pain type: intermittent Pain descriptors: pressure, sharp Pain duration: 15-20 sec unless passing a stool in which the pain will last until the bowels are emptied  Aggravating factors: full bladder, urination Relieving factors: counterpressure,    OCCUPATION/LEISURE ACTIVITIES:  Walking, running, hiking,   PLOF:  Independent  PATIENT GOALS: Alleviate the pain that I am feeling when I go to the bathroom  OBSTETRICAL HISTORY: G1P1 Deliveries: SVD Tearing/Episiotomy: 3rd degree Birthing position: dorsal lithotomy  GYNECOLOGICAL HISTORY: Hysterectomy: No  Endometriosis: Negative Last Menstrual Period:  Pain with exam: Yes  Prolapse: None Heaviness/pressure: No   UROLOGICAL HISTORY: Frequency of urination: every 2 hours Incontinence:  post-void, no apparent reason  Onset: 02/2022 Amount: Min. Protective undergarments: No   Fluid Intake: 120-160 oz H20, 3x/week coffee caffeinated, 0-1/day seltzers Nocturia: 1x/night Toileting posture: feet flat Incomplete emptying: Yes  Pain with urination: Positive for terminating urination and when bladder is completely  full Stream: Strong and Weak Urgency: No  Difficulty initiating urination: Positive for with full bladder Intermittent stream: Positive for initially; negative at present Frequent UTI: Negative.   GASTROINTESTINAL HISTORY: Childhood history of constipation.  Type of bowel movement: (Bristol Stool Scale) 2,3 without Miralax, 4 with Miralax Frequency of BMs: every other day without Miralax, multiple times/day with Miralax Incomplete bowel movement: No  Pain with defecation: Positive Straining with defecation: Positive for 40% Hemorrhoids: Negative ; internal/external; active/latent Toileting posture: feet flat Fiber supplement: No  Incontinence: Negative.    SEXUAL HISTORY AND FUNCTION: Sexually active: No  Pain with penetration: depending on position   TREATMENT- 05/21/2022 SUBJECTIVE: Patient has been doing stretches and HEP after workouts and has had no issues. Patient has not yet noticed any improvement in symptoms, but has had no worsening.  PAIN: Denies pain at present; full bladder 4/10  Pre-treatment assessment:  Manual Therapy:   Neuromuscular Re-education: - Supine Diastasis Recti Correction with Sheet  - 10 reps - Hooklying Transversus Abdominis Palpation  - 10 reps - Bent Knee Fallouts  - 2 sets - 10 reps - Hooklying Small March  - 2 sets - 10 reps - Supine Transversus Abdominis Bracing with Heel Slide  - 2 sets - 10 reps  Therapeutic Exercise:   Treatments unbilled:  Post-treatment assessment:  Patient educated throughout session on appropriate technique and form using multi-modal cueing, HEP, and activity modification. Patient articulated understanding and returned demonstration.  Patient Response to interventions: Comfortable to return in 1 week for deep core progression   PATIENT SURVEYS:  FOTO PFDI Pain 13; Urinary Problem 66  ASSESSMENT:  Clinical Impression: Patient presents to clinic with excellent motivation to participate in therapy.  Patient demonstrates deficits in PFM coordination, PFM strength, IAP management, and postural control. Patient with excellent control performing TrA and bent knee fall out, but had slightly more difficulty with small march and heel slide trunk stabilization during today's session and responded positively to active interventions. Patient will benefit from continued skilled therapeutic intervention to address remaining deficits in PFM coordination, PFM strength, IAP management, and postural control in order to increase function and improve overall QOL.    Objective impairments: decreased activity tolerance, decreased coordination, decreased endurance, decreased strength, improper body mechanics, postural dysfunction, and pain.   Activity limitations: interpersonal relationship and toileting .   Personal factors: Age, Past/current experiences, and Time since onset of injury/illness/exacerbation are also affecting patient's functional  outcome.   Rehab Potential: Good  Clinical decision making: Evolving/moderate complexity  Evaluation complexity: Moderate   GOALS: Goals reviewed with patient? Yes  LONG TERM GOALS: Target date: 07/24/2022  Patient will demonstrate improved function as evidenced by a score of 77 on FOTO measure for full participation in activities at home and in the community.  Baseline: 66 Goal status: INITIAL  Patient will demonstrate independence with HEP in order to maximize therapeutic gains and improve carryover from physical therapy sessions to ADLs in the home and community. Baseline: not initiated Goal status: INITIAL  Patient will demonstrate improved function as evidenced by a score of 0 on PFDI Pain measure for full participation in activities at home and in the community.  Baseline: 13 Goal status: INITIAL  Patient will report being able to return to activities including, but not limited to: partner intimacy and running without pain or limitation to indicate  complete resolution of the chief complaint and return to prior level of participation at home and in the community. Baseline: unable Goal status: INITIAL   PLAN: Rehab frequency: 1x/week  Rehab duration: 12 weeks  Planned interventions: Therapeutic exercises, Therapeutic activity, Neuromuscular re-education, Balance training, Gait training, Patient/Family education, Self Care, Joint mobilization, Electrical stimulation, Spinal mobilization, Cryotherapy, Moist heat, scar mobilization, Taping, and Manual therapy     Sheria Lang PT, DPT [phone]  05/21/2022, 3:20 PM

## 2022-05-22 ENCOUNTER — Encounter: Payer: BC Managed Care – PPO | Admitting: Physical Therapy

## 2022-05-26 ENCOUNTER — Ambulatory Visit: Payer: BC Managed Care – PPO | Admitting: Physical Therapy

## 2022-05-26 ENCOUNTER — Encounter: Payer: Self-pay | Admitting: Physical Therapy

## 2022-05-26 DIAGNOSIS — M6281 Muscle weakness (generalized): Secondary | ICD-10-CM

## 2022-05-26 DIAGNOSIS — R102 Pelvic and perineal pain unspecified side: Secondary | ICD-10-CM

## 2022-05-26 DIAGNOSIS — R278 Other lack of coordination: Secondary | ICD-10-CM

## 2022-05-26 NOTE — Therapy (Signed)
OUTPATIENT PHYSICAL THERAPY FEMALE PELVIC TREATMENT   Patient Name: Flannery Cavallero MRN: [id number] DOB:[birth year], 31 y.o., female Today's Date: 05/26/2022   PT End of Session - 05/26/22 1205     Visit Number 4    Number of Visits 12    Date for PT Re-Evaluation 07/24/22    Authorization Type IE 05/01/2022    PT Start Time 1200    PT Stop Time 1240    PT Time Calculation (min) 40 min    Activity Tolerance Patient tolerated treatment well    Behavior During Therapy [facility] for tasks assessed/performed             Past Medical History:  Diagnosis Date   Gestational diabetes    History reviewed. No pertinent surgical history. Patient Active Problem List   Diagnosis Date Noted   Encounter for planned induction of labor 03/14/2022   Acute left flank pain 01/10/2022   Diet controlled gestational diabetes mellitus (GDM), antepartum 01/07/2022   Rubella non-immune status, antepartum 10/20/2021   Rh negative status during pregnancy in second trimester 10/20/2021   Encounter for supervision of normal first pregnancy in third trimester 07/24/2021    PCP: Pcp, No  REFERRING PROVIDER: Tomasita Morrow*  REFERRING DIAG: R10.2 (ICD-10-CM) - Pelvic and perineal pain   THERAPY DIAG:  Pelvic pain  Other lack of coordination  Muscle weakness (generalized)  Rationale for Evaluation and Treatment Rehabilitation  PRECAUTIONS: None  WEIGHT BEARING RESTRICTIONS No  FALLS:  Has patient fallen in last 6 months? No  ONSET DATE: 03/15/2022 Initial evaluation: 05/01/2022 SUBJECTIVE:                                                                                                                                                                                           CHIEF COMPLAINT: Patient states that it has been a roller coaster since delivery. She has also had an infection since then. Patient notes that while still in the hospital she was cath'd for urination because she was  unable to void. Patient notes that she has intense pressure and pain after voiding now but is able to counterpressure with toilet tissue to offer some relief. Patient does encounter some UI but this is typically post-void or just when at rest. Patient does endorse a high level of fear around returning to sexual activity.   PERTINENT HISTORY/CHART REVIEW:  Red flags (bowel/bladder changes, saddle paresthesia, personal history of cancer, h/o spinal tumors, h/o compression fx, h/o abdominal aneurysm, abdominal pain, chills/fever, night sweats, nausea, vomiting, unrelenting pain, first onset of insidious LBP <20 y/o): Negative   PAIN:  Are you having pain? Yes  NPRS scale:  0/10 (current) 3-4/10 (average with typical fillign of bladder) 8/10 (worst) (full bladder, finish urinating);  Pain location: bladder, urethra Pain type: intermittent Pain descriptors: pressure, sharp Pain duration: 15-20 sec unless passing a stool in which the pain will last until the bowels are emptied  Aggravating factors: full bladder, urination Relieving factors: counterpressure,    OCCUPATION/LEISURE ACTIVITIES:  Walking, running, hiking,   PLOF:  Independent  PATIENT GOALS: Alleviate the pain that I am feeling when I go to the bathroom  OBSTETRICAL HISTORY: G1P1 Deliveries: SVD Tearing/Episiotomy: 3rd degree Birthing position: dorsal lithotomy  GYNECOLOGICAL HISTORY: Hysterectomy: No  Endometriosis: Negative Last Menstrual Period:  Pain with exam: Yes  Prolapse: None Heaviness/pressure: No   UROLOGICAL HISTORY: Frequency of urination: every 2 hours Incontinence:  post-void, no apparent reason  Onset: 02/2022 Amount: Min. Protective undergarments: No   Fluid Intake: 120-160 oz H20, 3x/week coffee caffeinated, 0-1/day seltzers Nocturia: 1x/night Toileting posture: feet flat Incomplete emptying: Yes  Pain with urination: Positive for terminating urination and when bladder is completely  full Stream: Strong and Weak Urgency: No  Difficulty initiating urination: Positive for with full bladder Intermittent stream: Positive for initially; negative at present Frequent UTI: Negative.   GASTROINTESTINAL HISTORY: Childhood history of constipation.  Type of bowel movement: (Bristol Stool Scale) 2,3 without Miralax, 4 with Miralax Frequency of BMs: every other day without Miralax, multiple times/day with Miralax Incomplete bowel movement: No  Pain with defecation: Positive Straining with defecation: Positive for 40% Hemorrhoids: Negative ; internal/external; active/latent Toileting posture: feet flat Fiber supplement: No  Incontinence: Negative.    SEXUAL HISTORY AND FUNCTION: Sexually active: No  Pain with penetration: depending on position   TREATMENT- 05/26/2022 SUBJECTIVE: Patient reports that she had another episode of nocturia where she had intense urge and weak stream. Patient was able to use a stand and sit reposition to encourage PFM relaxation. Patient notes that she continues to have intense pressure with full bladder that has not changed.  PAIN: Denies pain at present; full bladder 4/10  Pre-treatment assessment:  Manual Therapy:   Neuromuscular Re-education: Patient education on factors impacting sexual function, female sexual response cycle, fear-avoidance pain model, and sensate focus for nervous system downregulation.  Patient educated on quadruped deep core progressions.  Therapeutic Exercise:   Treatments unbilled:  Post-treatment assessment:  Patient educated throughout session on appropriate technique and form using multi-modal cueing, HEP, and activity modification. Patient articulated understanding and returned demonstration.  Patient Response to interventions: Comfortable to return in 1 week for deep core progression   PATIENT SURVEYS:  FOTO PFDI Pain 13; Urinary Problem 66  ASSESSMENT:  Clinical Impression: Patient presents to  clinic with excellent motivation to participate in therapy. Patient demonstrates deficits in PFM coordination, PFM strength, IAP management, and postural control. Patient very receptive to sexual function information during today's session and responded positively to educational interventions. Patient will benefit from continued skilled therapeutic intervention to address remaining deficits in PFM coordination, PFM strength, IAP management, and postural control in order to increase function and improve overall QOL.    Objective impairments: decreased activity tolerance, decreased coordination, decreased endurance, decreased strength, improper body mechanics, postural dysfunction, and pain.   Activity limitations: interpersonal relationship and toileting .   Personal factors: Age, Past/current experiences, and Time since onset of injury/illness/exacerbation are also affecting patient's functional outcome.   Rehab Potential: Good  Clinical decision making: Evolving/moderate complexity  Evaluation complexity: Moderate   GOALS: Goals reviewed with patient? Yes  LONG  TERM GOALS: Target date: 07/24/2022  Patient will demonstrate improved function as evidenced by a score of 77 on FOTO measure for full participation in activities at home and in the community.  Baseline: 66 Goal status: INITIAL  Patient will demonstrate independence with HEP in order to maximize therapeutic gains and improve carryover from physical therapy sessions to ADLs in the home and community. Baseline: not initiated Goal status: INITIAL  Patient will demonstrate improved function as evidenced by a score of 0 on PFDI Pain measure for full participation in activities at home and in the community.  Baseline: 13 Goal status: INITIAL  Patient will report being able to return to activities including, but not limited to: partner intimacy and running without pain or limitation to indicate complete resolution of the chief  complaint and return to prior level of participation at home and in the community. Baseline: unable Goal status: INITIAL   PLAN: Rehab frequency: 1x/week  Rehab duration: 12 weeks  Planned interventions: Therapeutic exercises, Therapeutic activity, Neuromuscular re-education, Balance training, Gait training, Patient/Family education, Self Care, Joint mobilization, Electrical stimulation, Spinal mobilization, Cryotherapy, Moist heat, scar mobilization, Taping, and Manual therapy     Sheria Lang PT, DPT [phone]  05/26/2022, 12:13 PM

## 2022-05-29 ENCOUNTER — Encounter: Payer: BC Managed Care – PPO | Admitting: Physical Therapy

## 2022-06-04 ENCOUNTER — Ambulatory Visit: Payer: BC Managed Care – PPO | Admitting: Physical Therapy

## 2022-06-11 ENCOUNTER — Encounter: Payer: Self-pay | Admitting: Physical Therapy

## 2022-06-11 ENCOUNTER — Ambulatory Visit: Payer: BC Managed Care – PPO | Attending: Obstetrics | Admitting: Physical Therapy

## 2022-06-11 DIAGNOSIS — R102 Pelvic and perineal pain: Secondary | ICD-10-CM | POA: Insufficient documentation

## 2022-06-11 DIAGNOSIS — M6281 Muscle weakness (generalized): Secondary | ICD-10-CM | POA: Diagnosis present

## 2022-06-11 DIAGNOSIS — R278 Other lack of coordination: Secondary | ICD-10-CM | POA: Diagnosis present

## 2022-06-11 NOTE — Therapy (Signed)
OUTPATIENT PHYSICAL THERAPY FEMALE PELVIC TREATMENT   Patient Name: Meredith Douglas MRN: [id number] DOB:[birth year], 31 y.o., female Today's Date: 06/11/2022   PT End of Session - 06/11/22 1201     Visit Number 5    Number of Visits 12    Date for PT Re-Evaluation 07/24/22    Authorization Type IE 05/01/2022    PT Start Time 1200    PT Stop Time 1240    PT Time Calculation (min) 40 min    Activity Tolerance Patient tolerated treatment well    Behavior During Therapy [facility] for tasks assessed/performed             Past Medical History:  Diagnosis Date   Gestational diabetes    History reviewed. No pertinent surgical history. Patient Active Problem List   Diagnosis Date Noted   Encounter for planned induction of labor 03/14/2022   Acute left flank pain 01/10/2022   Diet controlled gestational diabetes mellitus (GDM), antepartum 01/07/2022   Rubella non-immune status, antepartum 10/20/2021   Rh negative status during pregnancy in second trimester 10/20/2021   Encounter for supervision of normal first pregnancy in third trimester 07/24/2021    PCP: Pcp, No  REFERRING PROVIDER: Tomasita Morrow*  REFERRING DIAG: R10.2 (ICD-10-CM) - Pelvic and perineal pain   THERAPY DIAG:  Pelvic pain  Other lack of coordination  Muscle weakness (generalized)  Rationale for Evaluation and Treatment Rehabilitation  PRECAUTIONS: None  WEIGHT BEARING RESTRICTIONS No  FALLS:  Has patient fallen in last 6 months? No  ONSET DATE: 03/15/2022 Initial evaluation: 05/01/2022 SUBJECTIVE:                                                                                                                                                                                           CHIEF COMPLAINT: Patient states that it has been a roller coaster since delivery. She has also had an infection since then. Patient notes that while still in the hospital she was cath'd for urination because she was  unable to void. Patient notes that she has intense pressure and pain after voiding now but is able to counterpressure with toilet tissue to offer some relief. Patient does encounter some UI but this is typically post-void or just when at rest. Patient does endorse a high level of fear around returning to sexual activity.   PERTINENT HISTORY/CHART REVIEW:  Red flags (bowel/bladder changes, saddle paresthesia, personal history of cancer, h/o spinal tumors, h/o compression fx, h/o abdominal aneurysm, abdominal pain, chills/fever, night sweats, nausea, vomiting, unrelenting pain, first onset of insidious LBP <22 y/o): Negative   PAIN:  Are you having pain? Yes  NPRS scale:  0/10 (current) 3-4/10 (average with typical fillign of bladder) 8/10 (worst) (full bladder, finish urinating);  Pain location: bladder, urethra Pain type: intermittent Pain descriptors: pressure, sharp Pain duration: 15-20 sec unless passing a stool in which the pain will last until the bowels are emptied  Aggravating factors: full bladder, urination Relieving factors: counterpressure,    OCCUPATION/LEISURE ACTIVITIES:  Walking, running, hiking,   PLOF:  Independent  PATIENT GOALS: Alleviate the pain that I am feeling when I go to the bathroom  OBSTETRICAL HISTORY: G1P1 Deliveries: SVD Tearing/Episiotomy: 3rd degree Birthing position: dorsal lithotomy  GYNECOLOGICAL HISTORY: Hysterectomy: No  Endometriosis: Negative Last Menstrual Period:  Pain with exam: Yes  Prolapse: None Heaviness/pressure: No   UROLOGICAL HISTORY: Frequency of urination: every 2 hours Incontinence:  post-void, no apparent reason  Onset: 02/2022 Amount: Min. Protective undergarments: No   Fluid Intake: 120-160 oz H20, 3x/week coffee caffeinated, 0-1/day seltzers Nocturia: 1x/night Toileting posture: feet flat Incomplete emptying: Yes  Pain with urination: Positive for terminating urination and when bladder is completely  full Stream: Strong and Weak Urgency: No  Difficulty initiating urination: Positive for with full bladder Intermittent stream: Positive for initially; negative at present Frequent UTI: Negative.   GASTROINTESTINAL HISTORY: Childhood history of constipation.  Type of bowel movement: (Bristol Stool Scale) 2,3 without Miralax, 4 with Miralax Frequency of BMs: every other day without Miralax, multiple times/day with Miralax Incomplete bowel movement: No  Pain with defecation: Positive Straining with defecation: Positive for 40% Hemorrhoids: Negative ; internal/external; active/latent Toileting posture: feet flat Fiber supplement: No  Incontinence: Negative.    SEXUAL HISTORY AND FUNCTION: Sexually active: No  Pain with penetration: depending on position   TREATMENT- 06/11/2022 SUBJECTIVE: Patient notes that she feels she has plateau-ed to some degree. Patient notes with recent out of town travel she had limited ability to do exercises and stretches. Patient notes that she had increased pressure/discomfort with urinating in public restroom. Patient does note she is better able to store urine and is urinating less frequently.  PAIN: Denies pain at present; full bladder 4/10  Pre-treatment assessment:  Manual Therapy:   Neuromuscular Re-education: Supine knee to chest with PFM lengthening, BLE, for improved PFM spasm release Supine double knee to chest with PFM lengthening for improved PFM spasm release Supine butterfly with PFM lengthening, BLE, for improved PFM tissue length  Patient education on deep core/pelvic stabilization interventions incorporating baby as follows: (Access Code: [email or web address]) - Supine Bridge with Baby  - Clamshells with Baby  - Sidelying Hip Abduction with Baby  - Sidelying Hip Flexion and Extension with Baby  - Quadruped Push Up with Baby  - Bird Dog with Baby  - Partial Lunges with Baby  - Side Lunge with Baby   Therapeutic Exercise:   Treatments  unbilled:  Post-treatment assessment:  Patient educated throughout session on appropriate technique and form using multi-modal cueing, HEP, and activity modification. Patient articulated understanding and returned demonstration.  Patient Response to interventions: Comfortable to return in 1 week for return to running discussion   PATIENT SURVEYS:  FOTO PFDI Pain 13; Urinary Problem 66  ASSESSMENT:  Clinical Impression: Patient presents to clinic with excellent motivation to participate in therapy. Patient demonstrates deficits in PFM coordination, PFM strength, IAP management, and postural control. Patient able to perform PFM downtraining postures with minimal cueing and good form during today's session and responded positively to educational interventions. Patient will benefit from continued skilled therapeutic intervention to address remaining deficits in PFM coordination,  PFM strength, IAP management, and postural control in order to increase function and improve overall QOL.    Objective impairments: decreased activity tolerance, decreased coordination, decreased endurance, decreased strength, improper body mechanics, postural dysfunction, and pain.   Activity limitations: interpersonal relationship and toileting .   Personal factors: Age, Past/current experiences, and Time since onset of injury/illness/exacerbation are also affecting patient's functional outcome.   Rehab Potential: Good  Clinical decision making: Evolving/moderate complexity  Evaluation complexity: Moderate   GOALS: Goals reviewed with patient? Yes  LONG TERM GOALS: Target date: 07/24/2022  Patient will demonstrate improved function as evidenced by a score of 77 on FOTO measure for full participation in activities at home and in the community.  Baseline: 66 Goal status: INITIAL  Patient will demonstrate independence with HEP in order to maximize therapeutic gains and improve carryover from physical  therapy sessions to ADLs in the home and community. Baseline: not initiated Goal status: INITIAL  Patient will demonstrate improved function as evidenced by a score of 0 on PFDI Pain measure for full participation in activities at home and in the community.  Baseline: 13 Goal status: INITIAL  Patient will report being able to return to activities including, but not limited to: partner intimacy and running without pain or limitation to indicate complete resolution of the chief complaint and return to prior level of participation at home and in the community. Baseline: unable Goal status: INITIAL   PLAN: Rehab frequency: 1x/week  Rehab duration: 12 weeks  Planned interventions: Therapeutic exercises, Therapeutic activity, Neuromuscular re-education, Balance training, Gait training, Patient/Family education, Self Care, Joint mobilization, Electrical stimulation, Spinal mobilization, Cryotherapy, Moist heat, scar mobilization, Taping, and Manual therapy     Sheria Lang PT, DPT [phone]  06/11/2022, 12:15 PM

## 2022-06-18 ENCOUNTER — Ambulatory Visit: Payer: BC Managed Care – PPO | Admitting: Physical Therapy

## 2022-06-18 ENCOUNTER — Encounter: Payer: Self-pay | Admitting: Physical Therapy

## 2022-06-18 DIAGNOSIS — R278 Other lack of coordination: Secondary | ICD-10-CM

## 2022-06-18 DIAGNOSIS — R102 Pelvic and perineal pain: Secondary | ICD-10-CM | POA: Diagnosis not present

## 2022-06-18 DIAGNOSIS — M6281 Muscle weakness (generalized): Secondary | ICD-10-CM

## 2022-06-18 NOTE — Therapy (Signed)
OUTPATIENT PHYSICAL THERAPY FEMALE PELVIC TREATMENT   Patient Name: Meredith Douglas MRN: [id number] DOB:[birth year], 31 y.o., female Today's Date: 06/18/2022   PT End of Session - 06/18/22 1200     Visit Number 6    Number of Visits 12    Date for PT Re-Evaluation 07/24/22    Authorization Type IE 05/01/2022    PT Start Time 1200    PT Stop Time 1240    PT Time Calculation (min) 40 min    Activity Tolerance Patient tolerated treatment well    Behavior During Therapy [facility] for tasks assessed/performed             Past Medical History:  Diagnosis Date   Gestational diabetes    History reviewed. No pertinent surgical history. Patient Active Problem List   Diagnosis Date Noted   Encounter for planned induction of labor 03/14/2022   Acute left flank pain 01/10/2022   Diet controlled gestational diabetes mellitus (GDM), antepartum 01/07/2022   Rubella non-immune status, antepartum 10/20/2021   Rh negative status during pregnancy in second trimester 10/20/2021   Encounter for supervision of normal first pregnancy in third trimester 07/24/2021    PCP: Pcp, No  REFERRING PROVIDER: Tomasita Morrow*  REFERRING DIAG: R10.2 (ICD-10-CM) - Pelvic and perineal pain   THERAPY DIAG:  Pelvic pain  Other lack of coordination  Muscle weakness (generalized)  Rationale for Evaluation and Treatment Rehabilitation  PRECAUTIONS: None  WEIGHT BEARING RESTRICTIONS No  FALLS:  Has patient fallen in last 6 months? No  ONSET DATE: 03/15/2022 Initial evaluation: 05/01/2022 SUBJECTIVE:                                                                                                                                                                                           CHIEF COMPLAINT: Patient states that it has been a roller coaster since delivery. She has also had an infection since then. Patient notes that while still in the hospital she was cath'd for urination because she was  unable to void. Patient notes that she has intense pressure and pain after voiding now but is able to counterpressure with toilet tissue to offer some relief. Patient does encounter some UI but this is typically post-void or just when at rest. Patient does endorse a high level of fear around returning to sexual activity.   PERTINENT HISTORY/CHART REVIEW:  Red flags (bowel/bladder changes, saddle paresthesia, personal history of cancer, h/o spinal tumors, h/o compression fx, h/o abdominal aneurysm, abdominal pain, chills/fever, night sweats, nausea, vomiting, unrelenting pain, first onset of insidious LBP <20 y/o): Negative   PAIN:  Are you having pain? Yes  NPRS scale:  0/10 (current) 3-4/10 (average with typical fillign of bladder) 8/10 (worst) (full bladder, finish urinating);  Pain location: bladder, urethra Pain type: intermittent Pain descriptors: pressure, sharp Pain duration: 15-20 sec unless passing a stool in which the pain will last until the bowels are emptied  Aggravating factors: full bladder, urination Relieving factors: counterpressure,    OCCUPATION/LEISURE ACTIVITIES:  Walking, running, hiking,   PLOF:  Independent  PATIENT GOALS: Alleviate the pain that I am feeling when I go to the bathroom  OBSTETRICAL HISTORY: G1P1 Deliveries: SVD Tearing/Episiotomy: 3rd degree Birthing position: dorsal lithotomy  GYNECOLOGICAL HISTORY: Hysterectomy: No  Endometriosis: Negative Last Menstrual Period:  Pain with exam: Yes  Prolapse: None Heaviness/pressure: No   UROLOGICAL HISTORY: Frequency of urination: every 2 hours Incontinence:  post-void, no apparent reason  Onset: 02/2022 Amount: Min. Protective undergarments: No   Fluid Intake: 120-160 oz H20, 3x/week coffee caffeinated, 0-1/day seltzers Nocturia: 1x/night Toileting posture: feet flat Incomplete emptying: Yes  Pain with urination: Positive for terminating urination and when bladder is completely  full Stream: Strong and Weak Urgency: No  Difficulty initiating urination: Positive for with full bladder Intermittent stream: Positive for initially; negative at present Frequent UTI: Negative.   GASTROINTESTINAL HISTORY: Childhood history of constipation.  Type of bowel movement: (Bristol Stool Scale) 2,3 without Miralax, 4 with Miralax Frequency of BMs: every other day without Miralax, multiple times/day with Miralax Incomplete bowel movement: No  Pain with defecation: Positive Straining with defecation: Positive for 40% Hemorrhoids: Negative ; internal/external; active/latent Toileting posture: feet flat Fiber supplement: No  Incontinence: Negative.    SEXUAL HISTORY AND FUNCTION: Sexually active: No  Pain with penetration: depending on position   TREATMENT- 06/11/2022 SUBJECTIVE: Patient notes that she feels she has plateau-ed to some degree. Patient notes with recent out of town travel she had limited ability to do exercises and stretches. Patient notes that she had increased pressure/discomfort with urinating in public restroom. Patient does note she is better able to store urine and is urinating less frequently.  PAIN: Denies pain at present; full bladder 4/10  Pre-treatment assessment:  Manual Therapy:   Neuromuscular Re-education:   Therapeutic Exercise: Return to run strength screening: Single leg calf-raises, x20 Single leg bridge, x20 Single leg sit to stand, x20 Sidelying abduction, x20 Patient education on unilateral strength training as part of comprehensive strengthening for both postural control and sport of running. Provided with HEP: [id number]  Treatments unbilled:  Post-treatment assessment:  Patient educated throughout session on appropriate technique and form using multi-modal cueing, HEP, and activity modification. Patient articulated understanding and returned demonstration.  Patient Response to interventions:    PATIENT SURVEYS:  FOTO  PFDI Pain 13; Urinary Problem 66  ASSESSMENT:  Clinical Impression: Patient presents to clinic with excellent motivation to participate in therapy. Patient demonstrates deficits in PFM coordination, PFM strength, IAP management, and postural control. Patient with notable strength and motor control deficits performing SLSTS during today's session and responded positively to active and educational interventions. Patient will benefit from continued skilled therapeutic intervention to address remaining deficits in PFM coordination, PFM strength, IAP management, and postural control in order to increase function and improve overall QOL.    Objective impairments: decreased activity tolerance, decreased coordination, decreased endurance, decreased strength, improper body mechanics, postural dysfunction, and pain.   Activity limitations: interpersonal relationship and toileting .   Personal factors: Age, Past/current experiences, and Time since onset of injury/illness/exacerbation are also affecting patient's functional outcome.   Rehab Potential: Good  Clinical decision making: Evolving/moderate complexity  Evaluation complexity: Moderate   GOALS: Goals reviewed with patient? Yes  LONG TERM GOALS: Target date: 07/24/2022  Patient will demonstrate improved function as evidenced by a score of 77 on FOTO measure for full participation in activities at home and in the community.  Baseline: 66 Goal status: INITIAL  Patient will demonstrate independence with HEP in order to maximize therapeutic gains and improve carryover from physical therapy sessions to ADLs in the home and community. Baseline: not initiated Goal status: INITIAL  Patient will demonstrate improved function as evidenced by a score of 0 on PFDI Pain measure for full participation in activities at home and in the community.  Baseline: 13 Goal status: INITIAL  Patient will report being able to return to activities including,  but not limited to: partner intimacy and running without pain or limitation to indicate complete resolution of the chief complaint and return to prior level of participation at home and in the community. Baseline: unable Goal status: INITIAL   PLAN: Rehab frequency: 1x/week  Rehab duration: 12 weeks  Planned interventions: Therapeutic exercises, Therapeutic activity, Neuromuscular re-education, Balance training, Gait training, Patient/Family education, Self Care, Joint mobilization, Electrical stimulation, Spinal mobilization, Cryotherapy, Moist heat, scar mobilization, Taping, and Manual therapy     Sheria Lang PT, DPT [phone]  06/18/2022, 12:06 PM

## 2022-07-03 ENCOUNTER — Ambulatory Visit: Payer: BC Managed Care – PPO | Attending: Obstetrics | Admitting: Physical Therapy

## 2022-07-03 ENCOUNTER — Encounter: Payer: Self-pay | Admitting: Physical Therapy

## 2022-07-03 DIAGNOSIS — M6281 Muscle weakness (generalized): Secondary | ICD-10-CM | POA: Diagnosis present

## 2022-07-03 DIAGNOSIS — R278 Other lack of coordination: Secondary | ICD-10-CM | POA: Insufficient documentation

## 2022-07-03 DIAGNOSIS — R102 Pelvic and perineal pain: Secondary | ICD-10-CM | POA: Diagnosis not present

## 2022-07-03 NOTE — Therapy (Signed)
OUTPATIENT PHYSICAL THERAPY FEMALE PELVIC TREATMENT   Patient Name: Meredith Douglas MRN: [id number] DOB:[birth year], 31 y.o., female Today's Date: 07/03/2022   PT End of Session - 07/03/22 1349     Visit Number 7    Number of Visits 12    Date for PT Re-Evaluation 07/24/22    Authorization Type IE 05/01/2022    PT Start Time 1345    PT Stop Time 1425    PT Time Calculation (min) 40 min    Activity Tolerance Patient tolerated treatment well    Behavior During Therapy [facility] for tasks assessed/performed             Past Medical History:  Diagnosis Date   Gestational diabetes    History reviewed. No pertinent surgical history. Patient Active Problem List   Diagnosis Date Noted   Encounter for planned induction of labor 03/14/2022   Acute left flank pain 01/10/2022   Diet controlled gestational diabetes mellitus (GDM), antepartum 01/07/2022   Rubella non-immune status, antepartum 10/20/2021   Rh negative status during pregnancy in second trimester 10/20/2021   Encounter for supervision of normal first pregnancy in third trimester 07/24/2021    PCP: Pcp, No  REFERRING PROVIDER: Tomasita Morrow*  REFERRING DIAG: R10.2 (ICD-10-CM) - Pelvic and perineal pain   THERAPY DIAG:  Pelvic pain  Other lack of coordination  Muscle weakness (generalized)  Rationale for Evaluation and Treatment Rehabilitation  PRECAUTIONS: None  WEIGHT BEARING RESTRICTIONS No  FALLS:  Has patient fallen in last 6 months? No  ONSET DATE: 03/15/2022 Initial evaluation: 05/01/2022 SUBJECTIVE:                                                                                                                                                                                           CHIEF COMPLAINT: Patient states that it has been a roller coaster since delivery. She has also had an infection since then. Patient notes that while still in the hospital she was cath'd for urination because she was  unable to void. Patient notes that she has intense pressure and pain after voiding now but is able to counterpressure with toilet tissue to offer some relief. Patient does encounter some UI but this is typically post-void or just when at rest. Patient does endorse a high level of fear around returning to sexual activity.   PERTINENT HISTORY/CHART REVIEW:  Red flags (bowel/bladder changes, saddle paresthesia, personal history of cancer, h/o spinal tumors, h/o compression fx, h/o abdominal aneurysm, abdominal pain, chills/fever, night sweats, nausea, vomiting, unrelenting pain, first onset of insidious LBP <20 y/o): Negative   PAIN:  Are you having pain? Yes  NPRS scale:  0/10 (current) 3-4/10 (average with typical fillign of bladder) 8/10 (worst) (full bladder, finish urinating);  Pain location: bladder, urethra Pain type: intermittent Pain descriptors: pressure, sharp Pain duration: 15-20 sec unless passing a stool in which the pain will last until the bowels are emptied  Aggravating factors: full bladder, urination Relieving factors: counterpressure,    OCCUPATION/LEISURE ACTIVITIES:  Walking, running, hiking,   PLOF:  Independent  PATIENT GOALS: Alleviate the pain that I am feeling when I go to the bathroom  OBSTETRICAL HISTORY: G1P1 Deliveries: SVD Tearing/Episiotomy: 3rd degree Birthing position: dorsal lithotomy  GYNECOLOGICAL HISTORY: Hysterectomy: No  Endometriosis: Negative Last Menstrual Period:  Pain with exam: Yes  Prolapse: None Heaviness/pressure: No   UROLOGICAL HISTORY: Frequency of urination: every 2 hours Incontinence:  post-void, no apparent reason  Onset: 02/2022 Amount: Min. Protective undergarments: No   Fluid Intake: 120-160 oz H20, 3x/week coffee caffeinated, 0-1/day seltzers Nocturia: 1x/night Toileting posture: feet flat Incomplete emptying: Yes  Pain with urination: Positive for terminating urination and when bladder is completely  full Stream: Strong and Weak Urgency: No  Difficulty initiating urination: Positive for with full bladder Intermittent stream: Positive for initially; negative at present Frequent UTI: Negative.   GASTROINTESTINAL HISTORY: Childhood history of constipation.  Type of bowel movement: (Bristol Stool Scale) 2,3 without Miralax, 4 with Miralax Frequency of BMs: every other day without Miralax, multiple times/day with Miralax Incomplete bowel movement: No  Pain with defecation: Positive Straining with defecation: Positive for 40% Hemorrhoids: Negative ; internal/external; active/latent Toileting posture: feet flat Fiber supplement: No  Incontinence: Negative.    SEXUAL HISTORY AND FUNCTION: Sexually active: No  Pain with penetration: depending on position   TREATMENT- 07/03/2022 SUBJECTIVE: Patient states that she has resolved bowel concerns with administering miralax and stool softeners; was able to have soft BM with ease absent of stool softener/miralax this morning. Denies any pain or bleeding with that BM. Patient has been finding HEP to be useful. Patient did do some hiking with external load and noticed some increased PFM fatigue when climbing up hill. Patient notes that pressure is much improved. Patient notes that she has returned to sexual activity and had increased pain during and after. Patient did practice belly breathing before and after to help modulate pressure/pain. Patient states pain during deep penetration was 7-8/10, and after 7-8/10 pressure and dropping sensation. Pain duration post-coitally was about 3 minutes.    PAIN: Denies   Pre-treatment assessment:  Manual Therapy: Patient education posterior fourchette stretching and scar mobilization. Demonstration provided on anatomical model and provided with handout.   Neuromuscular Re-education: Patient education on pain modulation strategies for participation in penetrative sex: Use of OhNut buffer rings Pre and  post PFM stretches Ample time to achieve aroused state  Therapeutic Exercise:   Treatments unbilled:  Post-treatment assessment:  Patient educated throughout session on appropriate technique and form using multi-modal cueing, HEP, and activity modification. Patient articulated understanding and returned demonstration.  Patient Response to interventions:    PATIENT SURVEYS:  FOTO PFDI Pain 13; Urinary Problem 66  ASSESSMENT:  Clinical Impression: Patient presents to clinic with excellent motivation to participate in therapy. Patient demonstrates deficits in PFM coordination, PFM strength, IAP management, and postural control. Patient very receptive to education on manual interventions for decreased pelvic pain during today's session and responded positively to educational interventions. Patient will benefit from continued skilled therapeutic intervention to address remaining deficits in PFM coordination, PFM strength, IAP management, and postural control in order to  increase function and improve overall QOL.    Objective impairments: decreased activity tolerance, decreased coordination, decreased endurance, decreased strength, improper body mechanics, postural dysfunction, and pain.   Activity limitations: interpersonal relationship and toileting .   Personal factors: Age, Past/current experiences, and Time since onset of injury/illness/exacerbation are also affecting patient's functional outcome.   Rehab Potential: Good  Clinical decision making: Evolving/moderate complexity  Evaluation complexity: Moderate   GOALS: Goals reviewed with patient? Yes  LONG TERM GOALS: Target date: 07/24/2022  Patient will demonstrate improved function as evidenced by a score of 77 on FOTO measure for full participation in activities at home and in the community.  Baseline: 66 Goal status: INITIAL  Patient will demonstrate independence with HEP in order to maximize therapeutic gains and  improve carryover from physical therapy sessions to ADLs in the home and community. Baseline: not initiated Goal status: INITIAL  Patient will demonstrate improved function as evidenced by a score of 0 on PFDI Pain measure for full participation in activities at home and in the community.  Baseline: 13 Goal status: INITIAL  Patient will report being able to return to activities including, but not limited to: partner intimacy and running without pain or limitation to indicate complete resolution of the chief complaint and return to prior level of participation at home and in the community. Baseline: unable Goal status: INITIAL   PLAN: Rehab frequency: 1x/week  Rehab duration: 12 weeks  Planned interventions: Therapeutic exercises, Therapeutic activity, Neuromuscular re-education, Balance training, Gait training, Patient/Family education, Self Care, Joint mobilization, Electrical stimulation, Spinal mobilization, Cryotherapy, Moist heat, scar mobilization, Taping, and Manual therapy     Myles Gip PT, DPT [phone]  07/03/2022, 1:50 PM

## 2023-10-12 IMAGING — US US RENAL
1 series · 14 of 25 positions shown · non-contrast
Comparison: None.

CLINICAL DATA: Left flank pain, 31 weeks pregnant

EXAM:
RENAL / URINARY TRACT ULTRASOUND COMPLETE

[Series 1: us renal · 14 of 48 slices shown]
[im 1/48]
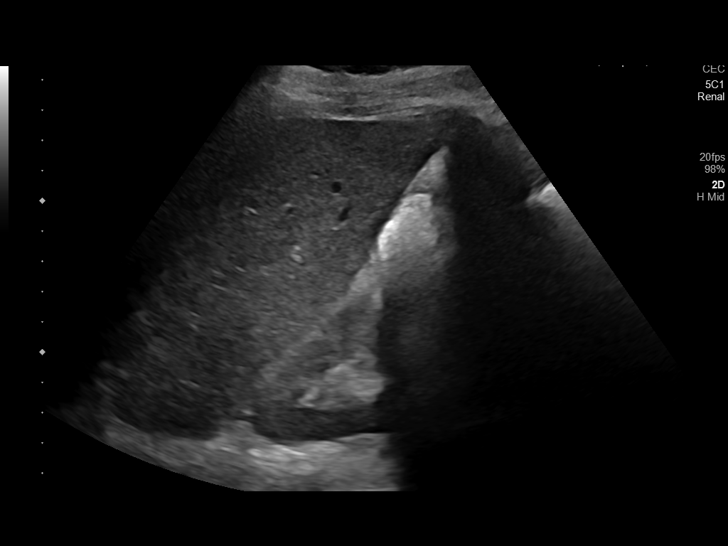
[im 4/48]
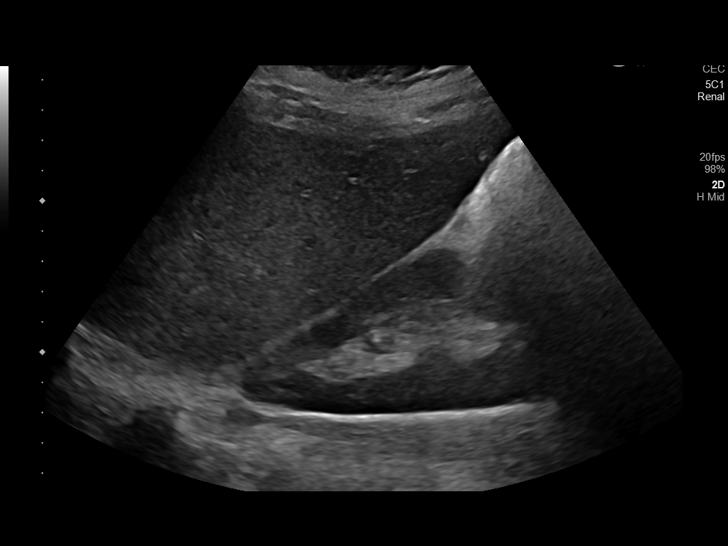
[im 8/48]
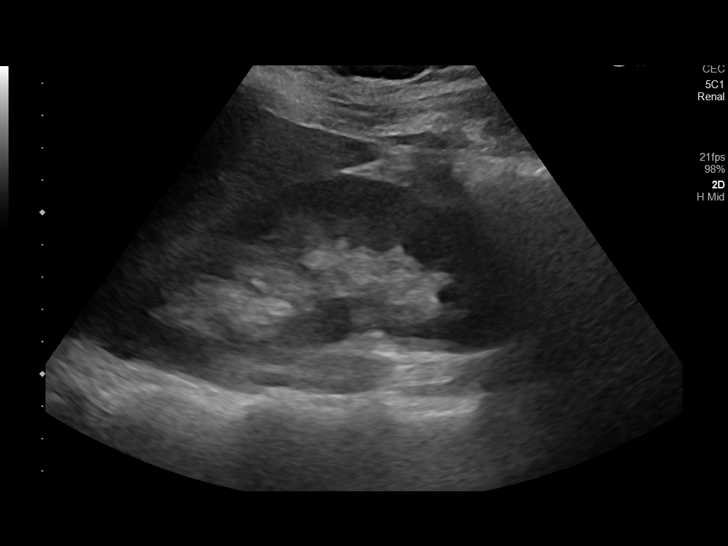
[im 12/48]
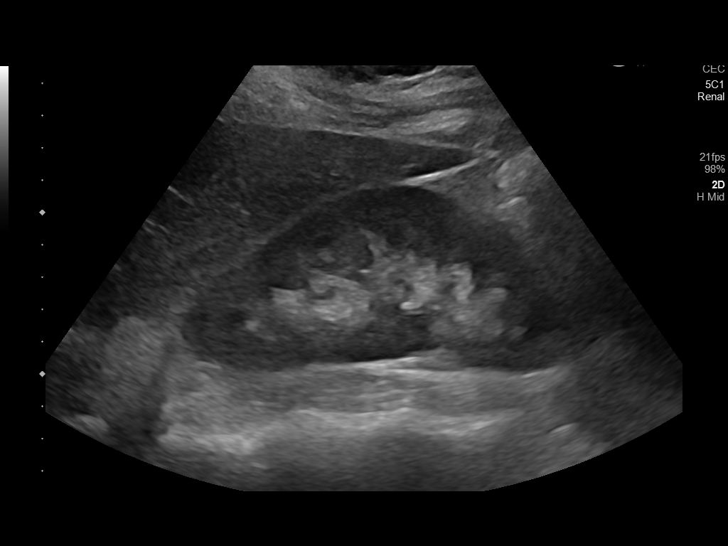
[im 16/48]
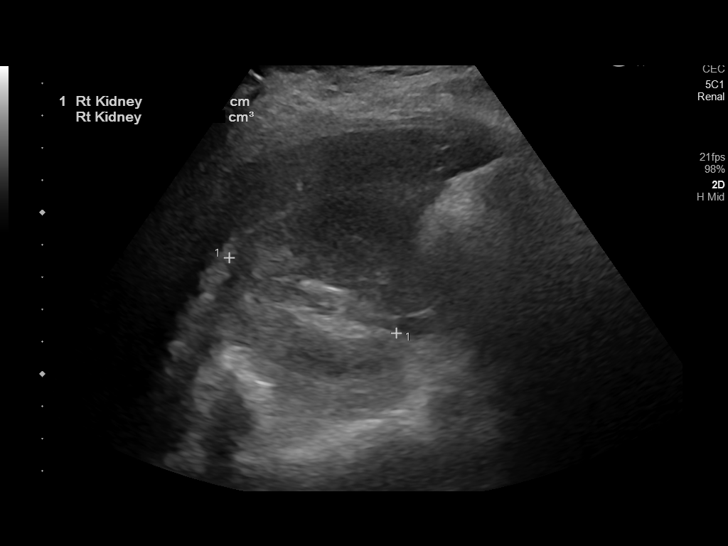
[im 18/48]
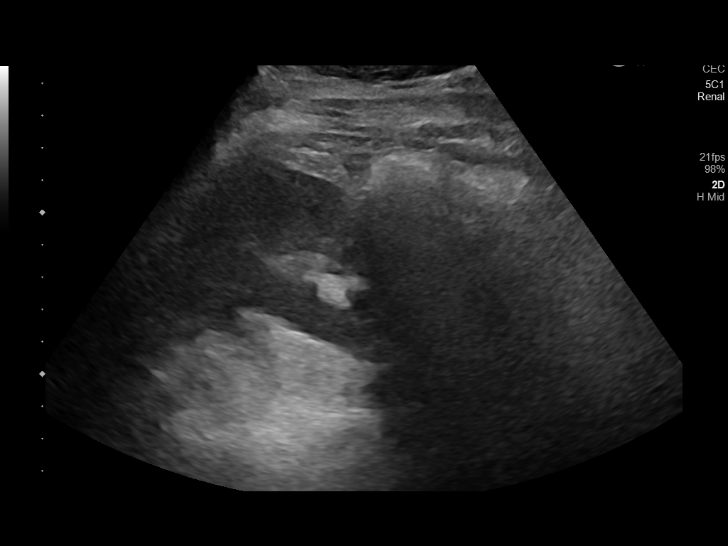
[im 22/48]
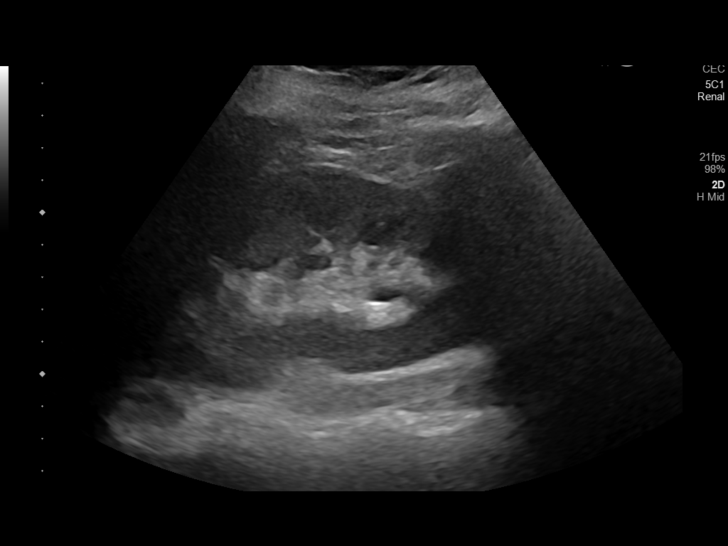
[im 26/48]
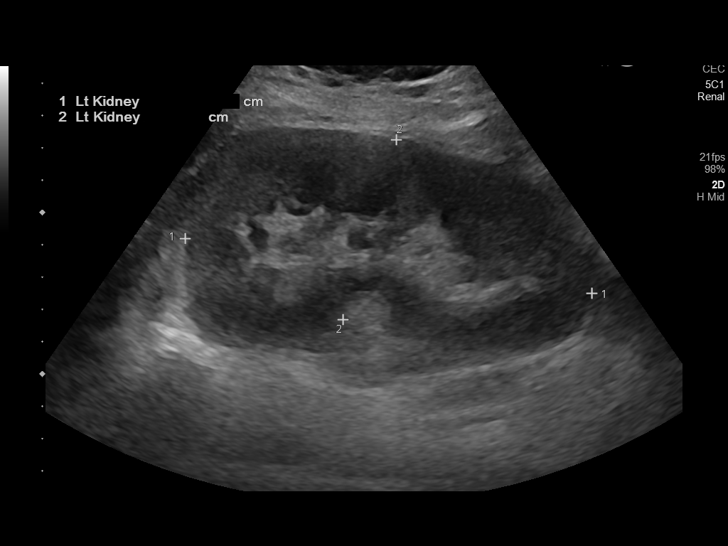
[im 30/48]
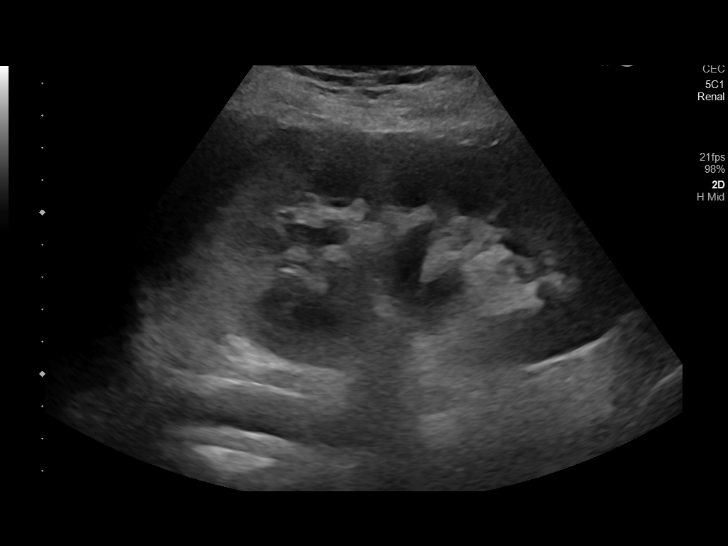
[im 32/48]
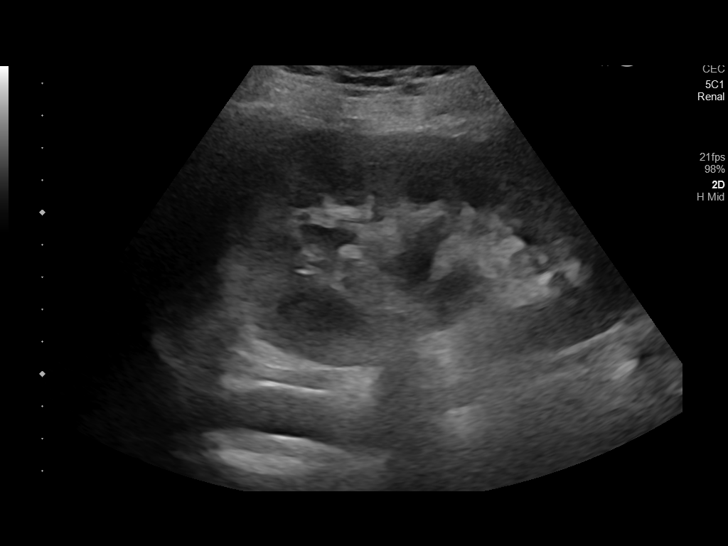
[im 36/48]
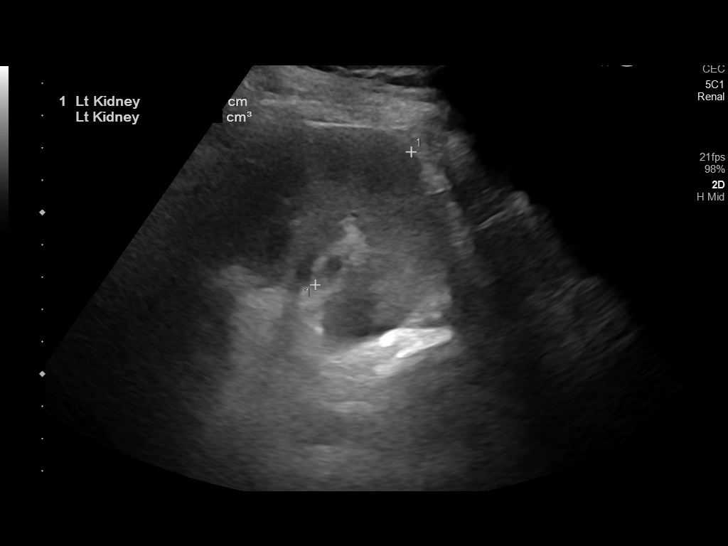
[im 40/48]
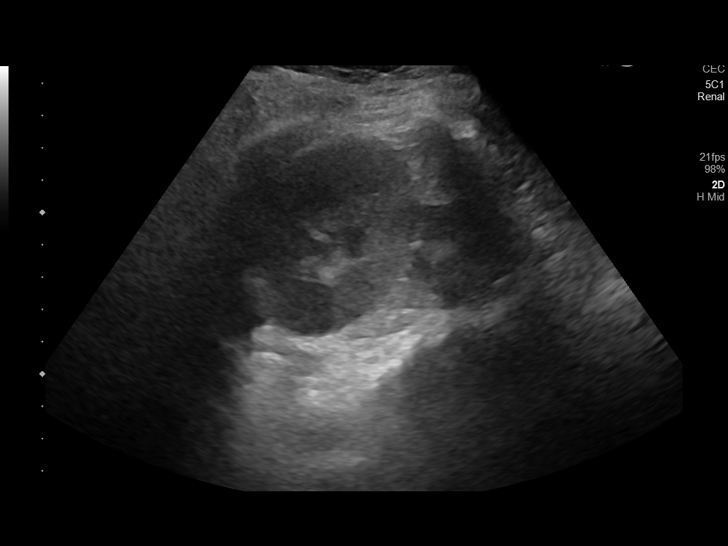
[im 44/48]
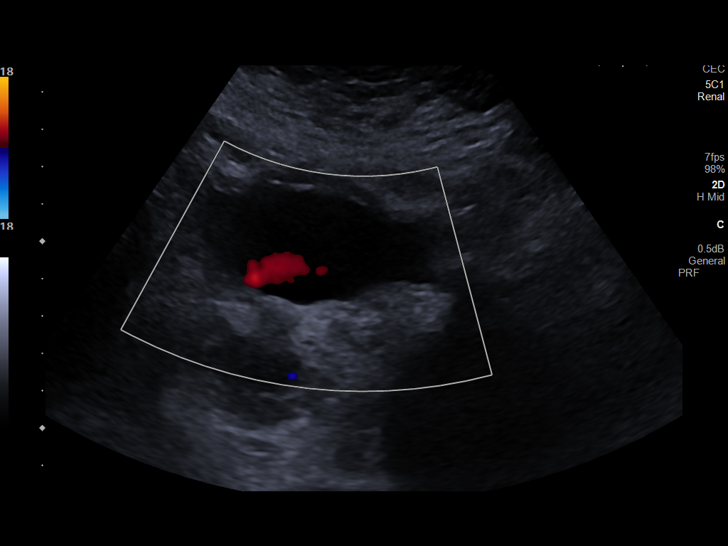
[im 48/48]
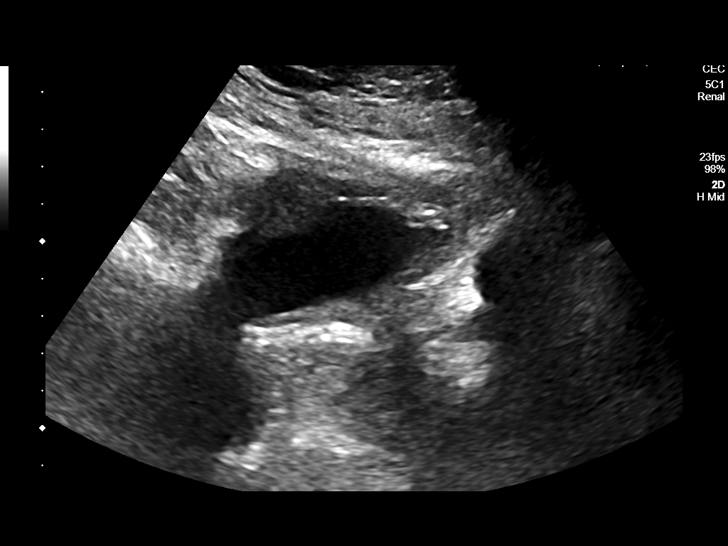

[14 of 25 positions shown; findings below may reference images not displayed]

FINDINGS: Right Kidney:

Renal measurements: 12.6 x 5.1 x 5.7 cm = volume: 192.4 mL.
Echogenicity within normal limits. No mass or hydronephrosis
visualized.

Left Kidney:

Renal measurements: 12.7 x 5.8 x 5.1 cm = volume: 196.3 mL.
Echogenicity within normal limits. Mild fullness of the renal pelvis
without hydronephrosis or renal mass.

Bladder:

Appears normal for degree of bladder distention.

Other:

None.
IMPRESSION: 1. Mild fullness of the left renal pelvis without frank
hydronephrosis. This may be physiologic dilatation given gravid
state.
2. Otherwise unremarkable exam.
